# Patient Record
Sex: Female | Born: 1985
Health system: Southern US, Community
[De-identification: ages and names within clinical notes are randomized; demographics above are authoritative.]

## PROBLEM LIST (undated history)

## (undated) DIAGNOSIS — F329 Major depressive disorder, single episode, unspecified: Secondary | ICD-10-CM

## (undated) DIAGNOSIS — F32A Depression, unspecified: Secondary | ICD-10-CM

## (undated) DIAGNOSIS — M1990 Unspecified osteoarthritis, unspecified site: Secondary | ICD-10-CM

## (undated) HISTORY — DX: Unspecified osteoarthritis, unspecified site: M19.90

## (undated) HISTORY — PX: WISDOM TOOTH EXTRACTION: SHX21

---
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
## 2012-03-01 DIAGNOSIS — M1990 Unspecified osteoarthritis, unspecified site: Secondary | ICD-10-CM

## 2012-03-01 HISTORY — DX: Unspecified osteoarthritis, unspecified site: M19.90

## 2012-11-28 ENCOUNTER — Ambulatory Visit (INDEPENDENT_AMBULATORY_CARE_PROVIDER_SITE_OTHER): Payer: 59 | Admitting: Family Medicine

## 2012-11-28 ENCOUNTER — Encounter: Payer: Self-pay | Admitting: Family Medicine

## 2012-11-28 VITALS — BP 103/67 | HR 60 | Temp 98.5°F | Ht 64.0 in | Wt 123.0 lb

## 2012-11-28 DIAGNOSIS — Z0000 Encounter for general adult medical examination without abnormal findings: Secondary | ICD-10-CM | POA: Insufficient documentation

## 2012-11-28 DIAGNOSIS — Z309 Encounter for contraceptive management, unspecified: Secondary | ICD-10-CM

## 2012-11-28 DIAGNOSIS — R1031 Right lower quadrant pain: Secondary | ICD-10-CM

## 2012-11-28 DIAGNOSIS — M1990 Unspecified osteoarthritis, unspecified site: Secondary | ICD-10-CM | POA: Insufficient documentation

## 2012-11-28 DIAGNOSIS — Z23 Encounter for immunization: Secondary | ICD-10-CM

## 2012-11-28 DIAGNOSIS — M1380 Other specified arthritis, unspecified site: Secondary | ICD-10-CM | POA: Insufficient documentation

## 2012-11-28 DIAGNOSIS — IMO0001 Reserved for inherently not codable concepts without codable children: Secondary | ICD-10-CM

## 2012-11-28 DIAGNOSIS — M064 Inflammatory polyarthropathy: Secondary | ICD-10-CM

## 2012-11-28 LAB — LIPID PANEL
LDL Cholesterol: 129 mg/dL — ABNORMAL HIGH (ref 0–99)
Total CHOL/HDL Ratio: 3.3 Ratio
VLDL: 18 mg/dL (ref 0–40)

## 2012-11-28 MED ORDER — NORGESTIMATE-ETH ESTRADIOL 0.25-35 MG-MCG PO TABS: 1.0000 | ORAL_TABLET | Freq: Every day | ORAL | Status: DC

## 2012-11-28 NOTE — Progress Notes (Signed)
  Subjective:    Patient ID: Michelle Lang, female    DOB: [birth year], 27 y.o.   MRN: [id number]  HPI 27 year old female new patient presents to establish care and have a routine physical:  #1 inflammatory arthritis: Patient with his inflammatory arthritis x2 years. She is established with a rheumatologist in the community. She takes Humira. She denies joint pain or swelling. She presented has significant pain and swelling in her bilateral knees and ankles. When she was initially diagnosed she experienced depression and anorexia. She lost 10 pounds. She has gained that weight back and is at her baseline weight.  #2 abdominal pain: Patient with history of intermittent abdominal pain previously attributed to ovarian cysts. She is not had significant pain since starting Sprintec. She takes Sprintec daily. She denies nausea, vomiting, diarrhea, constipation, melena. No personal or family history of irritable bowel syndrome, inflammatory bowel disease or colon cancer.  #3 health maintenance: Patient's last Pap smear was in 2013 and was normal. She has no history of abnormal Paps. She's never been screened for HIV or hyperlipidemia.   Social history reviewed and updated.  Review of Systems As per HPI     Objective:   Physical Exam  Constitutional: She is oriented to person, place, and time. She appears well-developed and well-nourished. No distress.  HENT:  Head: Normocephalic and atraumatic.  Right Ear: External ear normal.  Left Ear: External ear normal.  Mouth/Throat: Oropharynx is clear and moist. No oropharyngeal exudate.  Eyes: Conjunctivae and EOM are normal. Pupils are equal, round, and reactive to light. Right eye exhibits no discharge. Left eye exhibits no discharge.  Neck: Normal range of motion. Neck supple.  Cardiovascular: Normal rate, regular rhythm, normal heart sounds and intact distal pulses.  Exam reveals no gallop and no friction rub.   No murmur heard. Pulmonary/Chest:  Effort normal and breath sounds normal.  Abdominal: Bowel sounds are normal. She exhibits no distension and no mass. There is tenderness. There is no rebound and no guarding.    Musculoskeletal: Normal range of motion. She exhibits no edema and no tenderness.  Lymphadenopathy:    She has no cervical adenopathy.  Neurological: She is alert and oriented to person, place, and time.  Skin: Skin is warm and dry.  Psychiatric: She has a normal mood and affect.      Assessment & Plan:

## 2012-11-28 NOTE — Patient Instructions (Signed)
Wallace,  Very nice to see you again. I will be in touch with blood work results. You can also check your results on your "my chart" patient portal.  I have sent in your sprintec refill.  Please call or e-mail me via "mychart" if you develop changes in bowel habits, persistent or worsening right lower quadrant pain. Also call or e-mail me with the name of the office where your last pap smear was done.   Dr. Armen Pickup

## 2012-11-29 ENCOUNTER — Encounter: Payer: Self-pay | Admitting: Family Medicine

## 2012-11-29 DIAGNOSIS — IMO0001 Reserved for inherently not codable concepts without codable children: Secondary | ICD-10-CM | POA: Insufficient documentation

## 2012-11-29 DIAGNOSIS — R1031 Right lower quadrant pain: Secondary | ICD-10-CM | POA: Insufficient documentation

## 2012-11-29 NOTE — Assessment & Plan Note (Signed)
Sprintec refill x1 year.

## 2012-11-29 NOTE — Assessment & Plan Note (Signed)
Negative HIV and screening lipid panel.

## 2012-11-29 NOTE — Assessment & Plan Note (Signed)
Patient with right lower quadrant tenderness on palpation picked up on routine exam. No associated symptoms. Patient does have inflammatory arthritis and is currently being treated with Humira. Given her history I at increased concern for inflammatory bowel disease, however she does not experience pain at baseline, o changes in stool habits, fevers, chills or weight loss.  Advised patient to followup symptoms and contact me if she does become symptomatic. In the meantime she is advised to continue her regular diet.

## 2013-09-21 ENCOUNTER — Ambulatory Visit (INDEPENDENT_AMBULATORY_CARE_PROVIDER_SITE_OTHER): Payer: 59 | Admitting: *Deleted

## 2013-09-21 DIAGNOSIS — Z23 Encounter for immunization: Secondary | ICD-10-CM

## 2013-10-12 ENCOUNTER — Other Ambulatory Visit: Payer: Self-pay | Admitting: Family Medicine

## 2014-02-15 ENCOUNTER — Encounter: Payer: Self-pay | Admitting: Family Medicine

## 2014-02-15 ENCOUNTER — Ambulatory Visit (INDEPENDENT_AMBULATORY_CARE_PROVIDER_SITE_OTHER): Payer: 59 | Admitting: Family Medicine

## 2014-02-15 VITALS — BP 122/55 | HR 92 | Temp 100.6°F | Wt 121.0 lb

## 2014-02-15 DIAGNOSIS — J029 Acute pharyngitis, unspecified: Secondary | ICD-10-CM | POA: Insufficient documentation

## 2014-02-15 DIAGNOSIS — J020 Streptococcal pharyngitis: Secondary | ICD-10-CM

## 2014-02-15 LAB — POCT RAPID STREP A (OFFICE): RAPID STREP A SCREEN: NEGATIVE

## 2014-02-15 MED ORDER — AMOXICILLIN 500 MG PO CAPS: 500.0000 mg | ORAL_CAPSULE | Freq: Two times a day (BID) | ORAL | Status: DC

## 2014-02-15 NOTE — Progress Notes (Signed)
Patient ID: Michelle Lang, female   DOB: 26-Mar-1985, 28 y.o.   MRN: [id number]   HPI  Patient presents today for SDA for sore throat  She reports 3-4 days of sore throat, chills, malaise. She denies dyspnea, chest pain, palpitations. She has normal oral intake.  She is on Humira for inflammatory arthritis  Smoking status noted ROS: Per HPI  Objective: BP 122/55 mmHg  Pulse 92  Temp(Src) 100.6 F (38.1 C) (Oral)  Wt 121 lb (54.885 kg) Gen: NAD, alert, cooperative with exam HEENT: NCAT, MMM, swollen tonsils with white exudate, nontender shotty lymphadenopathy with several swollen lymph nodes on the left side CV: RRR, good S1/S2, no murmur Resp: CTABL, no wheezes, non-labored   Assessment and plan:  Acute pharyngitis Very likely acute strep pharyngitis given Centor score of 4 out of 4 Rapid strep negative but only has sensitivity of 70%, given Centor score will treat Treat with amoxicillin 5 mg twice daily 10 days Follow up as needed.     Orders Placed This Encounter  Procedures  . POCT rapid strep A    Meds ordered this encounter  Medications  . amoxicillin (AMOXIL) 500 MG capsule    Sig: Take 1 capsule (500 mg total) by mouth 2 (two) times daily.    Dispense:  20 capsule    Refill:  0

## 2014-02-15 NOTE — Patient Instructions (Signed)
Great to see you!  I have sent amoxicillin to the [facility] outpatient pharmacy  Strep Throat Strep throat is an infection of the throat caused by a bacteria named Streptococcus pyogenes. Your health care provider may call the infection streptococcal "tonsillitis" or "pharyngitis" depending on whether there are signs of inflammation in the tonsils or back of the throat. Strep throat is most common in children aged 28-15 years during the cold months of the year, but it can occur in people of any age during any season. This infection is spread from person to person (contagious) through coughing, sneezing, or other close contact. SIGNS AND SYMPTOMS   Fever or chills.  Painful, swollen, red tonsils or throat.  Pain or difficulty when swallowing.  White or yellow spots on the tonsils or throat.  Swollen, tender lymph nodes or "glands" of the neck or under the jaw.  Red rash all over the body (rare). DIAGNOSIS  Many different infections can cause the same symptoms. A test must be done to confirm the diagnosis so the right treatment can be given. A "rapid strep test" can help your health care provider make the diagnosis in a few minutes. If this test is not available, a light swab of the infected area can be used for a throat culture test. If a throat culture test is done, results are usually available in a day or two. TREATMENT  Strep throat is treated with antibiotic medicine. HOME CARE INSTRUCTIONS   Gargle with 1 tsp of salt in 1 cup of warm water, 3-4 times per day or as needed for comfort.  Family members who also have a sore throat or fever should be tested for strep throat and treated with antibiotics if they have the strep infection.  Make sure everyone in your household washes their hands well.  Do not share food, drinking cups, or personal items that could cause the infection to spread to others.  You may need to eat a soft food diet until your sore throat gets better.  Drink enough  water and fluids to keep your urine clear or pale yellow. This will help prevent dehydration.  Get plenty of rest.  Stay home from school, day care, or work until you have been on antibiotics for 24 hours.  Take medicines only as directed by your health care provider.  Take your antibiotic medicine as directed by your health care provider. Finish it even if you start to feel better. SEEK MEDICAL CARE IF:   The glands in your neck continue to enlarge.  You develop a rash, cough, or earache.  You cough up green, yellow-brown, or bloody sputum.  You have pain or discomfort not controlled by medicines.  Your problems seem to be getting worse rather than better.  You have a fever. SEEK IMMEDIATE MEDICAL CARE IF:   You develop any new symptoms such as vomiting, severe headache, stiff or painful neck, chest pain, shortness of breath, or trouble swallowing.  You develop severe throat pain, drooling, or changes in your voice.  You develop swelling of the neck, or the skin on the neck becomes red and tender.  You develop signs of dehydration, such as fatigue, dry mouth, and decreased urination.  You become increasingly sleepy, or you cannot wake up completely. MAKE SURE YOU:  Understand these instructions.  Will watch your condition.  Will get help right away if you are not doing well or get worse. Document Released: 02/13/2000 Document Revised: 07/02/2013 Document Reviewed: 04/16/2010 ExitCare Patient Information  2015 [employer]. This information is not intended to replace advice given to you by your health care provider. Make sure you discuss any questions you have with your health care provider.  

## 2014-02-15 NOTE — Assessment & Plan Note (Addendum)
Very likely acute strep pharyngitis given Centor score of 4 out of 4 Rapid strep negative but only has sensitivity of 70%, given Centor score will treat Treat with amoxicillin 5 mg twice daily 10 days Follow up as needed.

## 2014-09-25 ENCOUNTER — Other Ambulatory Visit: Payer: Self-pay | Admitting: Family Medicine

## 2014-09-25 ENCOUNTER — Telehealth: Payer: Self-pay | Admitting: Family Medicine

## 2014-09-25 DIAGNOSIS — Z3041 Encounter for surveillance of contraceptive pills: Secondary | ICD-10-CM

## 2014-09-25 MED ORDER — NORGESTIMATE-ETH ESTRADIOL 0.25-35 MG-MCG PO TABS: 1.0000 | ORAL_TABLET | Freq: Every day | ORAL | Status: DC

## 2014-09-26 NOTE — Telephone Encounter (Signed)
Opened in error

## 2014-10-08 DIAGNOSIS — M21619 Bunion of unspecified foot: Secondary | ICD-10-CM | POA: Insufficient documentation

## 2014-11-12 ENCOUNTER — Ambulatory Visit: Payer: 59 | Admitting: Sports Medicine

## 2014-12-25 ENCOUNTER — Ambulatory Visit: Payer: 59 | Admitting: Sports Medicine

## 2015-03-25 MED FILL — HUMIRA PEN 40 MG/0.8ML PNKT: 40 | 28 days supply | Qty: 2 | Fill #1

## 2015-05-02 MED FILL — HUMIRA PEN 40 MG/0.8ML PNKT: 40 | 28 days supply | Qty: 2 | Fill #2

## 2015-05-14 DIAGNOSIS — H5213 Myopia, bilateral: Secondary | ICD-10-CM | POA: Diagnosis not present

## 2015-06-09 MED FILL — HUMIRA PEN 40 MG/0.8ML PNKT: 40 | 28 days supply | Qty: 2 | Fill #3

## 2015-06-19 DIAGNOSIS — M0609 Rheumatoid arthritis without rheumatoid factor, multiple sites: Secondary | ICD-10-CM | POA: Diagnosis not present

## 2015-07-17 MED FILL — HUMIRA PEN 40 MG/0.8ML PNKT: 40 | 28 days supply | Qty: 2 | Fill #4

## 2015-09-29 DIAGNOSIS — Z01419 Encounter for gynecological examination (general) (routine) without abnormal findings: Secondary | ICD-10-CM | POA: Diagnosis not present

## 2015-09-29 DIAGNOSIS — Z6821 Body mass index (BMI) 21.0-21.9, adult: Secondary | ICD-10-CM | POA: Diagnosis not present

## 2015-09-29 DIAGNOSIS — Z3202 Encounter for pregnancy test, result negative: Secondary | ICD-10-CM | POA: Diagnosis not present

## 2015-12-18 DIAGNOSIS — M0609 Rheumatoid arthritis without rheumatoid factor, multiple sites: Secondary | ICD-10-CM | POA: Diagnosis not present

## 2015-12-18 MED FILL — HUMIRA PEN 40 MG/0.8ML PNKT: 40 | 28 days supply | Qty: 2 | Fill #0

## 2015-12-23 ENCOUNTER — Ambulatory Visit (HOSPITAL_BASED_OUTPATIENT_CLINIC_OR_DEPARTMENT_OTHER): Payer: 59 | Admitting: Pharmacist

## 2015-12-23 DIAGNOSIS — M069 Rheumatoid arthritis, unspecified: Secondary | ICD-10-CM

## 2015-12-23 MED ORDER — ADALIMUMAB 40 MG/0.8ML ~~LOC~~ AJKT: 0.8000 mL | AUTO-INJECTOR | SUBCUTANEOUS | 4 refills | Status: DC

## 2015-12-23 NOTE — Progress Notes (Signed)
   S: Patient presents today to the [facility].  Patient is currently taking Humira for rheumatoid arthritis. Patient is managed by Leigh Aurora for this.   Adherence: came off of Humira a few months ago as she was trying to get pregnant but it is taking longer than expected to become pregnant so she is back on it until she is pregnant.  Efficacy: has led to control of RA  Dosing:  Rheumatoid arthritis: SubQ: 40 mg every other week (may continue methotrexate, other nonbiologic DMARDS, corticosteroids, NSAIDs, and/or analgesics); patients not taking concomitant methotrexate may increase dose to 40 mg every week  Drug-drug interactions: none  Screening: TB test: completed per patient Hepatitis: completed per patient  Monitoring: S/sx of infection: denies CBC: regularly monitored by Dr. Melissa Noon office S/sx of hypersensitivity: denies S/sx of malignancy: denies S/sx of heart failure: denies  O:     No results found for: WBC, HGB, HCT, MCV, PLT    Chemistry   No results found for: NA, K, CL, CO2, BUN, CREATININE, GLU No results found for: CALCIUM, ALKPHOS, AST, ALT, BILITOT   Last labs from 2016 were WNL (reviewed via [facility])  A/P: 1. Medication review: Patient currently on Humira for rheumatoid arthritis. She is tolerating the medication well with no adverse effects and improved control of her RA. Reviewed the medication with the patient, including the following: Humira is a TNF blocking agent indicated for ankylosing spondylitis, Crohn's disease, Hidradenitis suppurativa, psoriatic arthritis, plaque psoriasis, ulcerative colitis, and uveitis. The most common adverse effects are infections, headache, and injection site reactions. There is the possibility of an increased risk of malignancy but it is not well understood if this increased risk is due to there medication or the disease state. There are rare cases of pancytopenia and  aplastic anemia. No recommendations for any changes and agree with plan to stop Humira once she become pregnant. Will request most recent lab work from Dr. Melissa Noon office.   Nicoletta Ba, PharmD, BCPS, BCACP, [facility] [phone]

## 2016-01-28 MED FILL — HUMIRA PEN 40 MG/0.8ML PNKT: 40 | 28 days supply | Qty: 2 | Fill #0

## 2016-02-24 MED FILL — HUMIRA PEN 40 MG/0.8ML PNKT: 40 | 28 days supply | Qty: 2 | Fill #1

## 2016-03-19 DIAGNOSIS — F4323 Adjustment disorder with mixed anxiety and depressed mood: Secondary | ICD-10-CM | POA: Diagnosis not present

## 2016-03-24 MED FILL — HUMIRA PEN 40 MG/0.8ML PNKT: 40 | 28 days supply | Qty: 2 | Fill #2

## 2016-03-25 DIAGNOSIS — F4323 Adjustment disorder with mixed anxiety and depressed mood: Secondary | ICD-10-CM | POA: Diagnosis not present

## 2016-04-14 DIAGNOSIS — N979 Female infertility, unspecified: Secondary | ICD-10-CM | POA: Diagnosis not present

## 2016-04-14 DIAGNOSIS — Z3149 Encounter for other procreative investigation and testing: Secondary | ICD-10-CM | POA: Diagnosis not present

## 2016-04-23 MED FILL — HUMIRA PEN 40 MG/0.8ML PNKT: 40 | 28 days supply | Qty: 2 | Fill #3

## 2016-04-30 DIAGNOSIS — N979 Female infertility, unspecified: Secondary | ICD-10-CM | POA: Diagnosis not present

## 2016-05-12 ENCOUNTER — Other Ambulatory Visit (HOSPITAL_COMMUNITY): Payer: Self-pay | Admitting: Obstetrics and Gynecology

## 2016-05-12 DIAGNOSIS — Z3141 Encounter for fertility testing: Secondary | ICD-10-CM

## 2016-05-17 ENCOUNTER — Ambulatory Visit (HOSPITAL_COMMUNITY)
Admission: RE | Admit: 2016-05-17 | Discharge: 2016-05-17 | Disposition: A | Discharge status: Home / Self Care | Payer: 59 | Source: Ambulatory Visit | Attending: Obstetrics and Gynecology | Admitting: Obstetrics and Gynecology

## 2016-05-17 DIAGNOSIS — Z3141 Encounter for fertility testing: Secondary | ICD-10-CM

## 2016-05-17 DIAGNOSIS — N979 Female infertility, unspecified: Secondary | ICD-10-CM | POA: Insufficient documentation

## 2016-05-17 MED ORDER — IOPAMIDOL (ISOVUE-300) INJECTION 61%
30.0000 mL | Freq: Once | INTRAVENOUS | Status: AC | PRN
  Administered 2016-05-17: 5 mL via ORAL

## 2016-05-20 MED FILL — HUMIRA PEN 40 MG/0.8ML PNKT: 40 | 28 days supply | Qty: 2 | Fill #4

## 2016-06-15 ENCOUNTER — Other Ambulatory Visit: Payer: Self-pay | Admitting: Pharmacist

## 2016-06-15 MED ORDER — ADALIMUMAB 40 MG/0.8ML ~~LOC~~ AJKT: 0.8000 mL | AUTO-INJECTOR | SUBCUTANEOUS | 0 refills | Status: DC

## 2016-06-15 MED FILL — HUMIRA PEN 40 MG/0.8ML PNKT: 40 | 28 days supply | Qty: 2 | Fill #0

## 2016-06-15 MED FILL — CLOMIPHENE CITRATE 50 MG TA: 50 | 30 days supply | Qty: 5 | Fill #0

## 2016-06-17 DIAGNOSIS — Z6822 Body mass index (BMI) 22.0-22.9, adult: Secondary | ICD-10-CM | POA: Diagnosis not present

## 2016-06-17 DIAGNOSIS — M0609 Rheumatoid arthritis without rheumatoid factor, multiple sites: Secondary | ICD-10-CM | POA: Diagnosis not present

## 2016-07-30 DIAGNOSIS — N979 Female infertility, unspecified: Secondary | ICD-10-CM | POA: Diagnosis not present

## 2016-08-03 MED FILL — CLOMIPHENE CITRATE 50 MG TA: 50 | 5 days supply | Qty: 5 | Fill #0

## 2016-08-09 ENCOUNTER — Other Ambulatory Visit: Payer: Self-pay | Admitting: Pharmacist

## 2016-08-09 MED ORDER — ADALIMUMAB 40 MG/0.8ML ~~LOC~~ AJKT: 0.8000 mL | AUTO-INJECTOR | SUBCUTANEOUS | 3 refills | Status: DC

## 2016-08-09 MED FILL — HUMIRA PEN 40 MG/0.8ML PNKT: 40 | 28 days supply | Qty: 2 | Fill #0

## 2016-08-24 DIAGNOSIS — N979 Female infertility, unspecified: Secondary | ICD-10-CM | POA: Diagnosis not present

## 2016-09-21 DIAGNOSIS — N911 Secondary amenorrhea: Secondary | ICD-10-CM | POA: Diagnosis not present

## 2016-09-30 DIAGNOSIS — Z3401 Encounter for supervision of normal first pregnancy, first trimester: Secondary | ICD-10-CM | POA: Diagnosis not present

## 2016-09-30 DIAGNOSIS — Z13228 Encounter for screening for other metabolic disorders: Secondary | ICD-10-CM | POA: Diagnosis not present

## 2016-09-30 DIAGNOSIS — Z3685 Encounter for antenatal screening for Streptococcus B: Secondary | ICD-10-CM | POA: Diagnosis not present

## 2016-10-14 DIAGNOSIS — O26891 Other specified pregnancy related conditions, first trimester: Secondary | ICD-10-CM | POA: Diagnosis not present

## 2016-10-14 DIAGNOSIS — Z3401 Encounter for supervision of normal first pregnancy, first trimester: Secondary | ICD-10-CM | POA: Diagnosis not present

## 2016-10-14 DIAGNOSIS — Z113 Encounter for screening for infections with a predominantly sexual mode of transmission: Secondary | ICD-10-CM | POA: Diagnosis not present

## 2016-10-14 DIAGNOSIS — Z3A10 10 weeks gestation of pregnancy: Secondary | ICD-10-CM | POA: Diagnosis not present

## 2016-10-14 DIAGNOSIS — Z3480 Encounter for supervision of other normal pregnancy, unspecified trimester: Secondary | ICD-10-CM | POA: Diagnosis not present

## 2016-10-27 DIAGNOSIS — Z36 Encounter for antenatal screening for chromosomal anomalies: Secondary | ICD-10-CM | POA: Diagnosis not present

## 2016-10-27 DIAGNOSIS — Z3682 Encounter for antenatal screening for nuchal translucency: Secondary | ICD-10-CM | POA: Diagnosis not present

## 2016-10-27 DIAGNOSIS — Z3491 Encounter for supervision of normal pregnancy, unspecified, first trimester: Secondary | ICD-10-CM | POA: Diagnosis not present

## 2016-12-10 DIAGNOSIS — Z363 Encounter for antenatal screening for malformations: Secondary | ICD-10-CM | POA: Diagnosis not present

## 2016-12-10 DIAGNOSIS — Z23 Encounter for immunization: Secondary | ICD-10-CM | POA: Diagnosis not present

## 2016-12-10 DIAGNOSIS — Z3400 Encounter for supervision of normal first pregnancy, unspecified trimester: Secondary | ICD-10-CM | POA: Diagnosis not present

## 2016-12-10 DIAGNOSIS — Z3A18 18 weeks gestation of pregnancy: Secondary | ICD-10-CM | POA: Diagnosis not present

## 2016-12-17 DIAGNOSIS — M0609 Rheumatoid arthritis without rheumatoid factor, multiple sites: Secondary | ICD-10-CM | POA: Diagnosis not present

## 2016-12-17 DIAGNOSIS — E663 Overweight: Secondary | ICD-10-CM | POA: Diagnosis not present

## 2016-12-17 DIAGNOSIS — Z6825 Body mass index (BMI) 25.0-25.9, adult: Secondary | ICD-10-CM | POA: Diagnosis not present

## 2017-01-11 DIAGNOSIS — Z3400 Encounter for supervision of normal first pregnancy, unspecified trimester: Secondary | ICD-10-CM | POA: Diagnosis not present

## 2017-01-11 DIAGNOSIS — Z3A23 23 weeks gestation of pregnancy: Secondary | ICD-10-CM | POA: Diagnosis not present

## 2017-01-11 DIAGNOSIS — Z362 Encounter for other antenatal screening follow-up: Secondary | ICD-10-CM | POA: Diagnosis not present

## 2017-02-04 DIAGNOSIS — L819 Disorder of pigmentation, unspecified: Secondary | ICD-10-CM | POA: Diagnosis not present

## 2017-02-04 DIAGNOSIS — D1801 Hemangioma of skin and subcutaneous tissue: Secondary | ICD-10-CM | POA: Diagnosis not present

## 2017-02-04 DIAGNOSIS — I788 Other diseases of capillaries: Secondary | ICD-10-CM | POA: Diagnosis not present

## 2017-02-10 DIAGNOSIS — O4402 Placenta previa specified as without hemorrhage, second trimester: Secondary | ICD-10-CM | POA: Diagnosis not present

## 2017-02-10 DIAGNOSIS — Z23 Encounter for immunization: Secondary | ICD-10-CM | POA: Diagnosis not present

## 2017-02-10 DIAGNOSIS — Z3A27 27 weeks gestation of pregnancy: Secondary | ICD-10-CM | POA: Diagnosis not present

## 2017-02-10 DIAGNOSIS — Z3400 Encounter for supervision of normal first pregnancy, unspecified trimester: Secondary | ICD-10-CM | POA: Diagnosis not present

## 2017-03-16 DIAGNOSIS — Z3A32 32 weeks gestation of pregnancy: Secondary | ICD-10-CM | POA: Diagnosis not present

## 2017-03-16 DIAGNOSIS — Z3400 Encounter for supervision of normal first pregnancy, unspecified trimester: Secondary | ICD-10-CM | POA: Diagnosis not present

## 2017-03-16 DIAGNOSIS — O3663X0 Maternal care for excessive fetal growth, third trimester, not applicable or unspecified: Secondary | ICD-10-CM | POA: Diagnosis not present

## 2017-03-16 DIAGNOSIS — O4403 Placenta previa specified as without hemorrhage, third trimester: Secondary | ICD-10-CM | POA: Diagnosis not present

## 2017-04-05 ENCOUNTER — Encounter (HOSPITAL_COMMUNITY): Payer: Self-pay | Admitting: *Deleted

## 2017-04-05 NOTE — H&P (Addendum)
Eola Waldrep is a 32 y.o. female presenting for primary c-section.  IVF pregnancy complicated by posterior placenta previa.  No VB.  Good FM. [place] confirms previa, EFW 92% OB History    Gravida Para Term Preterm AB Living   0 0 0 0 0 0   SAB TAB Ectopic Multiple Live Births   0 0 0 0       Past Medical History:  Diagnosis Date  . Inflammatory arthritis 2014   Past Surgical History:  Procedure Laterality Date  . APPENDECTOMY  2003   Family History: family history is not on file. Social History:  reports that  has never smoked. she has never used smokeless tobacco. She reports that she drinks about 7.7 oz of alcohol per week. She reports that she does not use drugs.     Maternal Diabetes: No Genetic Screening: Normal Maternal Ultrasounds/Referrals: Normal Fetal Ultrasounds or other Referrals:  None Maternal Substance Abuse:  No Significant Maternal Medications:  None Significant Maternal Lab Results:  Mild anemia Other Comments:  None  ROS History   AF, VSS Exam Physical Exam  Gen - NAD Abd - gravid, NT Ext - NT Prenatal labs: ABO, Rh:   Antibody:   Rubella:   RPR:    HBsAg:    HIV:    GBS:   neg  Assessment/Plan: Placenta previa Primary c-section  R/b/a discussed including risk of bleeding, transfusion, infection, damage to surrounding organs that may require further surgery to repair.  Also d/w pt and FOB risk of hysterectomy.  All questions answered, informed consent Marylynn Pearson 04/05/2017, 8:31 AM

## 2017-04-07 ENCOUNTER — Ambulatory Visit (HOSPITAL_BASED_OUTPATIENT_CLINIC_OR_DEPARTMENT_OTHER): Payer: 59 | Admitting: Pharmacist

## 2017-04-07 DIAGNOSIS — Z6829 Body mass index (BMI) 29.0-29.9, adult: Secondary | ICD-10-CM | POA: Diagnosis not present

## 2017-04-07 DIAGNOSIS — E663 Overweight: Secondary | ICD-10-CM | POA: Diagnosis not present

## 2017-04-07 DIAGNOSIS — M0609 Rheumatoid arthritis without rheumatoid factor, multiple sites: Secondary | ICD-10-CM | POA: Diagnosis not present

## 2017-04-07 DIAGNOSIS — Z79899 Other long term (current) drug therapy: Secondary | ICD-10-CM

## 2017-04-07 MED ORDER — ADALIMUMAB 40 MG/0.8ML ~~LOC~~ AJKT: 0.8000 mL | AUTO-INJECTOR | SUBCUTANEOUS | 5 refills | Status: DC

## 2017-04-07 MED FILL — HUMIRA PEN 40 MG/0.8ML PNKT: 40 | 28 days supply | Qty: 2 | Fill #0

## 2017-04-07 NOTE — Progress Notes (Signed)
   S: Patient presents today to the [facility].  Patient is NOT currently taking Humira for rheumatoid arthritis. However, she is getting ready to restart after her planned C section this month. Patient is managed by Leigh Aurora for this.   Adherence: has been off while she has been pregnant.  Efficacy: has led to control of RA when on it, currently doing ok.  Dosing:  Rheumatoid arthritis: SubQ: 40 mg every other week (may continue methotrexate, other nonbiologic DMARDS, corticosteroids, NSAIDs, and/or analgesics); patients not taking concomitant methotrexate may increase dose to 40 mg every week  Drug-drug interactions: none  Screening: TB test: completed per patient Hepatitis: completed per patient  Monitoring: S/sx of infection: denies CBC: regularly monitored by Dr. Melissa Noon office   O:     No results found for: WBC, HGB, HCT, MCV, PLT    Chemistry   No results found for: NA, K, CL, CO2, BUN, CREATININE, GLU No results found for: CALCIUM, ALKPHOS, AST, ALT, BILITOT   Last labs from 2016 were WNL (reviewed via [facility])  A/P: 1. Medication review: Patient currently holding Humira for rheumatoid arthritis until after baby is delivered this month. Patient has been on Humira previously and has no questions, aware of increased risk of infection and possible increased risk of malignancy and is closely follow by Dr. Amil Amen. No recommendations for any changes.    Christella Hartigan, PharmD, BCPS, BCACP, [facility] [phone]

## 2017-04-14 DIAGNOSIS — O4403 Placenta previa specified as without hemorrhage, third trimester: Secondary | ICD-10-CM | POA: Diagnosis not present

## 2017-04-14 DIAGNOSIS — Z3400 Encounter for supervision of normal first pregnancy, unspecified trimester: Secondary | ICD-10-CM | POA: Diagnosis not present

## 2017-04-14 DIAGNOSIS — Z3685 Encounter for antenatal screening for Streptococcus B: Secondary | ICD-10-CM | POA: Diagnosis not present

## 2017-04-14 DIAGNOSIS — Z3A36 36 weeks gestation of pregnancy: Secondary | ICD-10-CM | POA: Diagnosis not present

## 2017-04-19 ENCOUNTER — Encounter (HOSPITAL_COMMUNITY)
Admission: RE | Admit: 2017-04-19 | Discharge: 2017-04-19 | Disposition: A | Discharge status: Home / Self Care | Payer: 59 | Source: Ambulatory Visit | Attending: Obstetrics and Gynecology | Admitting: Obstetrics and Gynecology

## 2017-04-19 DIAGNOSIS — O9902 Anemia complicating childbirth: Secondary | ICD-10-CM | POA: Diagnosis not present

## 2017-04-19 DIAGNOSIS — D649 Anemia, unspecified: Secondary | ICD-10-CM | POA: Diagnosis not present

## 2017-04-19 DIAGNOSIS — Z3A37 37 weeks gestation of pregnancy: Secondary | ICD-10-CM | POA: Diagnosis not present

## 2017-04-19 DIAGNOSIS — O4403 Placenta previa specified as without hemorrhage, third trimester: Secondary | ICD-10-CM | POA: Diagnosis not present

## 2017-04-19 HISTORY — DX: Depression, unspecified: F32.A

## 2017-04-19 HISTORY — DX: Major depressive disorder, single episode, unspecified: F32.9

## 2017-04-19 LAB — ABO/RH: ABO/RH(D): A POS

## 2017-04-19 LAB — CBC
HCT: 33.9 % — ABNORMAL LOW (ref 36.0–46.0)
HEMOGLOBIN: 11.6 g/dL — AB (ref 12.0–15.0)
MCH: 31 pg (ref 26.0–34.0)
MCHC: 34.2 g/dL (ref 30.0–36.0)
MCV: 90.6 fL (ref 78.0–100.0)
PLATELETS: 191 10*3/uL (ref 150–400)
RBC: 3.74 MIL/uL — AB (ref 3.87–5.11)
RDW: 16.5 % — ABNORMAL HIGH (ref 11.5–15.5)
WBC: 10.1 10*3/uL (ref 4.0–10.5)

## 2017-04-19 NOTE — Patient Instructions (Signed)
Hila Bolding  04/19/2017   Your procedure is scheduled on:  04/20/2017  Enter through the Main Entrance of [facility] at [place] up the phone at the desk and dial [phone]  Call this number if you have problems the morning of surgery:[phone]  Remember:   Do not eat food:(After Midnight) Desps de medianoche.  Do not drink clear liquids: (After Midnight) Desps de medianoche.  Take these medicines the morning of surgery with A SIP OF WATER: none   Do not wear jewelry, make-up or nail polish.  Do not wear lotions, powders, or perfumes. Do not wear deodorant.  Do not shave 48 hours prior to surgery.  Do not bring valuables to the hospital.  [facility] is not   responsible for any belongings or valuables brought to the hospital.  Contacts, dentures or bridgework may not be worn into surgery.  Leave suitcase in the car. After surgery it may be brought to your room.  For patients admitted to the hospital, checkout time is 11:00 AM the day of              discharge.    N/A   Please read over the following fact sheets that you were given:   Surgical Site Infection Prevention

## 2017-04-20 ENCOUNTER — Inpatient Hospital Stay (HOSPITAL_COMMUNITY): Payer: 59 | Admitting: Anesthesiology

## 2017-04-20 ENCOUNTER — Inpatient Hospital Stay (HOSPITAL_COMMUNITY)
Admission: AD | Admit: 2017-04-20 | Discharge: 2017-04-22 | DRG: 788 | Disposition: A | Discharge status: Home / Self Care | Payer: 59 | Source: Ambulatory Visit | Attending: Obstetrics and Gynecology | Admitting: Obstetrics and Gynecology

## 2017-04-20 ENCOUNTER — Encounter (HOSPITAL_COMMUNITY): Payer: Self-pay | Admitting: *Deleted

## 2017-04-20 ENCOUNTER — Other Ambulatory Visit: Payer: Self-pay

## 2017-04-20 ENCOUNTER — Encounter (HOSPITAL_COMMUNITY)
Admission: AD | Disposition: A | Discharge status: Home / Self Care | Payer: Self-pay | Source: Ambulatory Visit | Attending: Obstetrics and Gynecology

## 2017-04-20 DIAGNOSIS — O4400 Placenta previa specified as without hemorrhage, unspecified trimester: Secondary | ICD-10-CM | POA: Diagnosis not present

## 2017-04-20 DIAGNOSIS — O9902 Anemia complicating childbirth: Secondary | ICD-10-CM | POA: Diagnosis present

## 2017-04-20 DIAGNOSIS — D649 Anemia, unspecified: Secondary | ICD-10-CM | POA: Diagnosis present

## 2017-04-20 DIAGNOSIS — O4423 Partial placenta previa NOS or without hemorrhage, third trimester: Secondary | ICD-10-CM | POA: Diagnosis not present

## 2017-04-20 DIAGNOSIS — Z8759 Personal history of other complications of pregnancy, childbirth and the puerperium: Secondary | ICD-10-CM

## 2017-04-20 DIAGNOSIS — Z3A00 Weeks of gestation of pregnancy not specified: Secondary | ICD-10-CM | POA: Diagnosis not present

## 2017-04-20 DIAGNOSIS — O4403 Placenta previa specified as without hemorrhage, third trimester: Secondary | ICD-10-CM | POA: Diagnosis present

## 2017-04-20 DIAGNOSIS — Z3A37 37 weeks gestation of pregnancy: Secondary | ICD-10-CM

## 2017-04-20 LAB — RPR: RPR: NONREACTIVE

## 2017-04-20 LAB — PREPARE RBC (CROSSMATCH)

## 2017-04-20 SURGERY — Surgical Case
Anesthesia: Spinal

## 2017-04-20 MED ORDER — NALOXONE HCL 0.4 MG/ML IJ SOLN: 0.4000 mg | INTRAMUSCULAR | Status: DC | PRN

## 2017-04-20 MED ORDER — ONDANSETRON HCL 4 MG/2ML IJ SOLN
INTRAMUSCULAR | Status: DC | PRN
  Administered 2017-04-20: 4 mg via INTRAVENOUS

## 2017-04-20 MED ORDER — PHENYLEPHRINE 40 MCG/ML (10ML) SYRINGE FOR IV PUSH (FOR BLOOD PRESSURE SUPPORT)
PREFILLED_SYRINGE | INTRAVENOUS | Status: AC
  Filled 2017-04-20: qty 10

## 2017-04-20 MED ORDER — FENTANYL CITRATE (PF) 100 MCG/2ML IJ SOLN
INTRAMUSCULAR | Status: AC
  Filled 2017-04-20: qty 2

## 2017-04-20 MED ORDER — SODIUM CHLORIDE 0.9% FLUSH: 3.0000 mL | INTRAVENOUS | Status: DC | PRN

## 2017-04-20 MED ORDER — NALBUPHINE HCL 10 MG/ML IJ SOLN: 5.0000 mg | Freq: Once | INTRAMUSCULAR | Status: DC | PRN

## 2017-04-20 MED ORDER — PHENYLEPHRINE 8 MG IN D5W 100 ML (0.08MG/ML) PREMIX OPTIME
INJECTION | INTRAVENOUS | Status: DC | PRN
  Administered 2017-04-20: 60 ug/min via INTRAVENOUS

## 2017-04-20 MED ORDER — SIMETHICONE 80 MG PO CHEW: 80.0000 mg | CHEWABLE_TABLET | ORAL | Status: DC | PRN

## 2017-04-20 MED ORDER — PHENYLEPHRINE HCL 10 MG/ML IJ SOLN
INTRAMUSCULAR | Status: DC | PRN
  Administered 2017-04-20: 40 ug via INTRAVENOUS

## 2017-04-20 MED ORDER — PROMETHAZINE HCL 25 MG/ML IJ SOLN: 6.2500 mg | INTRAMUSCULAR | Status: DC | PRN

## 2017-04-20 MED ORDER — ACETAMINOPHEN 325 MG PO TABS
650.0000 mg | ORAL_TABLET | ORAL | Status: DC | PRN
  Administered 2017-04-20 – 2017-04-21 (×2): 650 mg via ORAL
  Filled 2017-04-20 (×2): qty 2

## 2017-04-20 MED ORDER — MENTHOL 3 MG MT LOZG: 1.0000 | LOZENGE | OROMUCOSAL | Status: DC | PRN

## 2017-04-20 MED ORDER — KETOROLAC TROMETHAMINE 30 MG/ML IJ SOLN: 30.0000 mg | Freq: Once | INTRAMUSCULAR | Status: DC | PRN

## 2017-04-20 MED ORDER — DIPHENHYDRAMINE HCL 25 MG PO CAPS: 25.0000 mg | ORAL_CAPSULE | Freq: Four times a day (QID) | ORAL | Status: DC | PRN

## 2017-04-20 MED ORDER — OXYTOCIN 10 UNIT/ML IJ SOLN
INTRAVENOUS | Status: DC | PRN
  Administered 2017-04-20: 40 [IU] via INTRAVENOUS

## 2017-04-20 MED ORDER — MEASLES, MUMPS & RUBELLA VAC ~~LOC~~ INJ
0.5000 mL | INJECTION | Freq: Once | SUBCUTANEOUS | Status: DC
  Filled 2017-04-20: qty 0.5

## 2017-04-20 MED ORDER — HYDROCODONE-ACETAMINOPHEN 5-325 MG PO TABS
1.0000 | ORAL_TABLET | ORAL | Status: DC | PRN
  Administered 2017-04-21 – 2017-04-22 (×3): 1 via ORAL
  Filled 2017-04-20 (×3): qty 1

## 2017-04-20 MED ORDER — OXYCODONE HCL 5 MG PO TABS: 5.0000 mg | ORAL_TABLET | Freq: Once | ORAL | Status: DC | PRN

## 2017-04-20 MED ORDER — MORPHINE SULFATE (PF) 0.5 MG/ML IJ SOLN
INTRAMUSCULAR | Status: AC
  Filled 2017-04-20: qty 10

## 2017-04-20 MED ORDER — SCOPOLAMINE 1 MG/3DAYS TD PT72
1.0000 | MEDICATED_PATCH | Freq: Once | TRANSDERMAL | Status: DC
  Administered 2017-04-20: 1.5 mg via TRANSDERMAL
  Filled 2017-04-20: qty 1

## 2017-04-20 MED ORDER — NALBUPHINE HCL 10 MG/ML IJ SOLN: 5.0000 mg | INTRAMUSCULAR | Status: DC | PRN

## 2017-04-20 MED ORDER — ONDANSETRON HCL 4 MG/2ML IJ SOLN
INTRAMUSCULAR | Status: AC
  Filled 2017-04-20: qty 2

## 2017-04-20 MED ORDER — PRENATAL MULTIVITAMIN CH
1.0000 | ORAL_TABLET | Freq: Every day | ORAL | Status: DC
  Administered 2017-04-21 – 2017-04-22 (×2): 1 via ORAL
  Filled 2017-04-20 (×2): qty 1

## 2017-04-20 MED ORDER — LIDOCAINE HCL (PF) 1 % IJ SOLN
INTRAMUSCULAR | Status: AC
  Filled 2017-04-20: qty 5

## 2017-04-20 MED ORDER — MEPERIDINE HCL 25 MG/ML IJ SOLN: 6.2500 mg | INTRAMUSCULAR | Status: DC | PRN

## 2017-04-20 MED ORDER — TETANUS-DIPHTH-ACELL PERTUSSIS 5-2.5-18.5 LF-MCG/0.5 IM SUSP: 0.5000 mL | Freq: Once | INTRAMUSCULAR | Status: DC

## 2017-04-20 MED ORDER — HYDROMORPHONE HCL 1 MG/ML IJ SOLN: 0.2500 mg | INTRAMUSCULAR | Status: DC | PRN

## 2017-04-20 MED ORDER — NALOXONE HCL 4 MG/10ML IJ SOLN
1.0000 ug/kg/h | INTRAVENOUS | Status: DC | PRN
  Filled 2017-04-20: qty 5

## 2017-04-20 MED ORDER — MEDROXYPROGESTERONE ACETATE 150 MG/ML IM SUSP: 150.0000 mg | INTRAMUSCULAR | Status: DC | PRN

## 2017-04-20 MED ORDER — OXYTOCIN 40 UNITS IN LACTATED RINGERS INFUSION - SIMPLE MED: 2.5000 [IU]/h | INTRAVENOUS | Status: AC

## 2017-04-20 MED ORDER — LACTATED RINGERS IV SOLN
INTRAVENOUS | Status: DC | PRN
  Administered 2017-04-20: 13:00:00 via INTRAVENOUS

## 2017-04-20 MED ORDER — PHENYLEPHRINE 8 MG IN D5W 100 ML (0.08MG/ML) PREMIX OPTIME
INJECTION | INTRAVENOUS | Status: AC
  Filled 2017-04-20: qty 100

## 2017-04-20 MED ORDER — DIPHENHYDRAMINE HCL 25 MG PO CAPS: 25.0000 mg | ORAL_CAPSULE | ORAL | Status: DC | PRN

## 2017-04-20 MED ORDER — DEXTROSE IN LACTATED RINGERS 5 % IV SOLN
INTRAVENOUS | Status: DC
  Administered 2017-04-20: 18:00:00 via INTRAVENOUS

## 2017-04-20 MED ORDER — WITCH HAZEL-GLYCERIN EX PADS: 1.0000 "application " | MEDICATED_PAD | CUTANEOUS | Status: DC | PRN

## 2017-04-20 MED ORDER — ONDANSETRON HCL 4 MG/2ML IJ SOLN: 4.0000 mg | Freq: Three times a day (TID) | INTRAMUSCULAR | Status: DC | PRN

## 2017-04-20 MED ORDER — BUPIVACAINE IN DEXTROSE 0.75-8.25 % IT SOLN
INTRATHECAL | Status: DC | PRN
  Administered 2017-04-20: 1.5 mL via INTRATHECAL

## 2017-04-20 MED ORDER — OXYTOCIN 10 UNIT/ML IJ SOLN
INTRAMUSCULAR | Status: AC
  Filled 2017-04-20: qty 4

## 2017-04-20 MED ORDER — SIMETHICONE 80 MG PO CHEW
80.0000 mg | CHEWABLE_TABLET | Freq: Three times a day (TID) | ORAL | Status: DC
  Administered 2017-04-21 – 2017-04-22 (×4): 80 mg via ORAL
  Filled 2017-04-20 (×4): qty 1

## 2017-04-20 MED ORDER — FENTANYL CITRATE (PF) 100 MCG/2ML IJ SOLN
INTRAMUSCULAR | Status: DC | PRN
  Administered 2017-04-20: 10 ug via INTRATHECAL

## 2017-04-20 MED ORDER — MORPHINE SULFATE (PF) 0.5 MG/ML IJ SOLN
INTRAMUSCULAR | Status: DC | PRN
  Administered 2017-04-20: .2 mg via INTRATHECAL

## 2017-04-20 MED ORDER — SENNOSIDES-DOCUSATE SODIUM 8.6-50 MG PO TABS
2.0000 | ORAL_TABLET | ORAL | Status: DC
  Administered 2017-04-20 – 2017-04-21 (×2): 2 via ORAL
  Filled 2017-04-20 (×2): qty 2

## 2017-04-20 MED ORDER — SIMETHICONE 80 MG PO CHEW
80.0000 mg | CHEWABLE_TABLET | ORAL | Status: DC
  Administered 2017-04-20 – 2017-04-21 (×2): 80 mg via ORAL
  Filled 2017-04-20 (×2): qty 1

## 2017-04-20 MED ORDER — DIPHENHYDRAMINE HCL 50 MG/ML IJ SOLN: 12.5000 mg | INTRAMUSCULAR | Status: DC | PRN

## 2017-04-20 MED ORDER — OXYCODONE HCL 5 MG/5ML PO SOLN: 5.0000 mg | Freq: Once | ORAL | Status: DC | PRN

## 2017-04-20 MED ORDER — LACTATED RINGERS IV SOLN
INTRAVENOUS | Status: DC
  Administered 2017-04-20 (×2): via INTRAVENOUS

## 2017-04-20 MED ORDER — CEFAZOLIN SODIUM-DEXTROSE 2-4 GM/100ML-% IV SOLN
2.0000 g | INTRAVENOUS | Status: AC
  Administered 2017-04-20: 2 g via INTRAVENOUS

## 2017-04-20 MED ORDER — COCONUT OIL OIL: 1.0000 "application " | TOPICAL_OIL | Status: DC | PRN

## 2017-04-20 MED ORDER — DIBUCAINE 1 % RE OINT: 1.0000 "application " | TOPICAL_OINTMENT | RECTAL | Status: DC | PRN

## 2017-04-20 MED ORDER — IBUPROFEN 600 MG PO TABS
600.0000 mg | ORAL_TABLET | Freq: Four times a day (QID) | ORAL | Status: DC
  Administered 2017-04-20 – 2017-04-22 (×8): 600 mg via ORAL
  Filled 2017-04-20 (×8): qty 1

## 2017-04-20 MED ORDER — FAMOTIDINE 20 MG PO TABS
20.0000 mg | ORAL_TABLET | Freq: Once | ORAL | Status: AC
  Administered 2017-04-20: 20 mg via ORAL
  Filled 2017-04-20: qty 1

## 2017-04-20 SURGICAL SUPPLY — 30 items
CHLORAPREP W/TINT 26ML (MISCELLANEOUS) ×3 IMPLANT
CLAMP CORD UMBIL (MISCELLANEOUS) IMPLANT
CLOTH BEACON ORANGE TIMEOUT ST (SAFETY) ×3 IMPLANT
DERMABOND ADVANCED (GAUZE/BANDAGES/DRESSINGS) ×2
DERMABOND ADVANCED .7 DNX12 (GAUZE/BANDAGES/DRESSINGS) ×1 IMPLANT
DRSG OPSITE POSTOP 4X10 (GAUZE/BANDAGES/DRESSINGS) ×3 IMPLANT
ELECT REM PT RETURN 9FT ADLT (ELECTROSURGICAL) ×3
ELECTRODE REM PT RTRN 9FT ADLT (ELECTROSURGICAL) ×1 IMPLANT
EXTRACTOR VACUUM M CUP 4 TUBE (SUCTIONS) IMPLANT
EXTRACTOR VACUUM M CUP 4' TUBE (SUCTIONS)
GLOVE BIO SURGEON STRL SZ 6.5 (GLOVE) ×2 IMPLANT
GLOVE BIO SURGEONS STRL SZ 6.5 (GLOVE) ×1
GLOVE BIOGEL PI IND STRL 7.0 (GLOVE) ×2 IMPLANT
GLOVE BIOGEL PI INDICATOR 7.0 (GLOVE) ×4
GOWN STRL REUS W/TWL LRG LVL3 (GOWN DISPOSABLE) ×6 IMPLANT
KIT ABG SYR 3ML LUER SLIP (SYRINGE) IMPLANT
NEEDLE HYPO 25X5/8 SAFETYGLIDE (NEEDLE) IMPLANT
NS IRRIG 1000ML POUR BTL (IV SOLUTION) ×3 IMPLANT
PACK C SECTION WH (CUSTOM PROCEDURE TRAY) ×3 IMPLANT
PAD OB MATERNITY 4.3X12.25 (PERSONAL CARE ITEMS) ×3 IMPLANT
PENCIL SMOKE EVAC W/HOLSTER (ELECTROSURGICAL) ×3 IMPLANT
SUT CHROMIC 0 CT 802H (SUTURE) IMPLANT
SUT CHROMIC 0 CTX 36 (SUTURE) ×9 IMPLANT
SUT MON AB-0 CT1 36 (SUTURE) ×3 IMPLANT
SUT PDS AB 0 CTX 60 (SUTURE) ×3 IMPLANT
SUT PLAIN 0 NONE (SUTURE) IMPLANT
SUT VIC AB 4-0 KS 27 (SUTURE) IMPLANT
SYR BULB 3OZ (MISCELLANEOUS) ×3 IMPLANT
TOWEL OR 17X24 6PK STRL BLUE (TOWEL DISPOSABLE) ×3 IMPLANT
TRAY FOLEY BAG SILVER LF 14FR (SET/KITS/TRAYS/PACK) IMPLANT

## 2017-04-20 NOTE — Anesthesia Procedure Notes (Signed)
Spinal  Patient location during procedure: OR Start time: 04/20/2017 1:00 PM End time: 04/20/2017 1:10 PM Staffing Anesthesiologist: Murvin Natal, MD Performed: anesthesiologist  Preanesthetic Checklist Completed: patient identified, surgical consent, pre-op evaluation, timeout performed, IV checked, risks and benefits discussed and monitors and equipment checked Spinal Block Patient position: sitting Prep: DuraPrep Patient monitoring: cardiac monitor, continuous pulse ox and blood pressure Approach: midline Location: L4-5 Injection technique: single-shot Needle Needle type: Pencan  Needle gauge: 24 G Needle length: 9 cm Assessment Sensory level: T10 Additional Notes Functioning IV was confirmed and monitors were applied. Sterile prep and drape, including hand hygiene and sterile gloves were used. The patient was positioned and the spine was prepped. The skin was anesthetized with lidocaine.  Free flow of clear CSF was obtained prior to injecting local anesthetic into the CSF.  The spinal needle aspirated freely following injection.  The needle was carefully withdrawn.  The patient tolerated the procedure well.

## 2017-04-20 NOTE — Anesthesia Postprocedure Evaluation (Signed)
Anesthesia Post Note  Patient: Tashima Scarpulla  Procedure(s) Performed: CESAREAN SECTION (N/A )     Patient location during evaluation: PACU Anesthesia Type: Spinal Level of consciousness: oriented and awake and alert Pain management: pain level controlled Vital Signs Assessment: post-procedure vital signs reviewed and stable Respiratory status: spontaneous breathing, respiratory function stable and patient connected to nasal cannula oxygen Cardiovascular status: blood pressure returned to baseline and stable Postop Assessment: no headache, no backache and no apparent nausea or vomiting Anesthetic complications: no    Last Vitals:  Vitals:   04/20/17 1521 04/20/17 1627  BP: 115/69   Pulse: (!) 54   Resp: 18 20  Temp: 36.6 C   SpO2: 98%     Last Pain:  Vitals:   04/20/17 1627  TempSrc: Oral  PainSc:    Pain Goal:                 Karyl Kinnier Chyane Greer

## 2017-04-20 NOTE — Anesthesia Preprocedure Evaluation (Addendum)
Anesthesia Evaluation  Patient identified by MRN, date of birth, ID band Patient awake    Reviewed: Allergy & Precautions, NPO status , Patient's Chart, lab work & pertinent test results  Airway Mallampati: II  TM Distance: >3 FB Neck ROM: Full    Dental no notable dental hx.    Pulmonary neg pulmonary ROS,    Pulmonary exam normal breath sounds clear to auscultation       Cardiovascular negative cardio ROS Normal cardiovascular exam Rhythm:Regular Rate:Normal     Neuro/Psych PSYCHIATRIC DISORDERS Depression negative neurological ROS     GI/Hepatic negative GI ROS, Neg liver ROS,   Endo/Other  negative endocrine ROS  Renal/GU negative Renal ROS     Musculoskeletal negative musculoskeletal ROS (+)   Abdominal   Peds  Hematology  (+) anemia ,   Anesthesia Other Findings placenta previa  Reproductive/Obstetrics (+) Pregnancy                            Anesthesia Physical Anesthesia Plan  ASA: II  Anesthesia Plan: Spinal   Post-op Pain Management:    Induction:   PONV Risk Score and Plan: 2 and Ondansetron, Treatment may vary due to age or medical condition and Scopolamine patch - Pre-op  Airway Management Planned: Natural Airway  Additional Equipment:   Intra-op Plan:   Post-operative Plan:   Informed Consent: I have reviewed the patients History and Physical, chart, labs and discussed the procedure including the risks, benefits and alternatives for the proposed anesthesia with the patient or authorized representative who has indicated his/her understanding and acceptance.   Dental advisory given  Plan Discussed with: CRNA  Anesthesia Plan Comments:         Anesthesia Quick Evaluation

## 2017-04-20 NOTE — Op Note (Signed)
Cesarean Section Procedure Note   Michelle Lang  04/20/2017  Indications: Scheduled Proceedure/Maternal Request   Pre-operative Diagnosis: placenta previa.   Post-operative Diagnosis: Same   Surgeon: Juliann Mule) and Role:    * Marylynn Pearson, MD - Primary    Linda Hedges, DO - Assisting   Assistants: Linda Hedges, DO  Anesthesia: spinal   Procedure Details:  The patient was seen in the Holding Room. The risks, benefits, complications, treatment options, and expected outcomes were discussed with the patient. The patient concurred with the proposed plan, giving informed consent. identified as Michelle Lang and the procedure verified as C-Section Delivery. A Time Out was held and the above information confirmed.  After induction of anesthesia, the patient was draped and prepped in the usual sterile manner. A transverse was made and carried down through the subcutaneous tissue to the fascia. Fascial incision was made and extended transversely. The fascia was separated from the underlying rectus tissue superiorly and inferiorly. The peritoneum was identified and entered. Peritoneal incision was extended longitudinally. The utero-vesical peritoneal reflection was incised transversely and the bladder flap was bluntly freed from the lower uterine segment. A low transverse uterine incision was made. Delivered from cephalic presentation was a viable female infant. Cord ph was not sent the umbilical cord was clamped and cut cord blood was obtained for evaluation. The placenta was removed Intact and appeared normal. The uterine outline, tubes and ovaries appeared normal}. The uterine incision was closed with running locked sutures of 0chromic gut.   Hemostasis was observed. Lavage was carried out until clear. The fascia was then reapproximated with running sutures of 0PDS.  The skin was closed with 4-0Vicryl.   Instrument, sponge, and needle counts were correct prior the abdominal closure and were correct at  the conclusion of the case.     Estimated Blood Loss: 752 mL   Urine Output: clear  Specimens: placenta   Complications: no complications  Disposition: PACU - hemodynamically stable.   Maternal Condition: stable   Baby condition / location:  Couplet care / Skin to Skin  Attending Attestation: I was present and scrubbed for the entire procedure.   Signed: Surgeon(s): Marylynn Pearson, MD Linda Hedges, DO

## 2017-04-20 NOTE — Transfer of Care (Signed)
Immediate Anesthesia Transfer of Care Note  Patient: Michelle Lang  Procedure(s) Performed: CESAREAN SECTION (N/A )  Patient Location: PACU  Anesthesia Type:Spinal  Level of Consciousness: awake, alert  and oriented  Airway & Oxygen Therapy: Patient Spontanous Breathing  Post-op Assessment: Report given to RN and Post -op Vital signs reviewed and stable  Post vital signs: Reviewed and stable  Last Vitals:  Vitals:   04/20/17 1139  BP: 114/78  Pulse: 73  Resp: 16  Temp: 36.8 C    Last Pain:  Vitals:   04/20/17 1139  TempSrc: Oral         Complications: No apparent anesthesia complications

## 2017-04-21 ENCOUNTER — Encounter (HOSPITAL_COMMUNITY): Payer: Self-pay | Admitting: *Deleted

## 2017-04-21 LAB — CBC
HCT: 30.3 % — ABNORMAL LOW (ref 36.0–46.0)
HEMOGLOBIN: 10.4 g/dL — AB (ref 12.0–15.0)
MCH: 31.2 pg (ref 26.0–34.0)
MCHC: 34.3 g/dL (ref 30.0–36.0)
MCV: 91 fL (ref 78.0–100.0)
PLATELETS: 165 10*3/uL (ref 150–400)
RBC: 3.33 MIL/uL — ABNORMAL LOW (ref 3.87–5.11)
RDW: 16.5 % — ABNORMAL HIGH (ref 11.5–15.5)
WBC: 12.9 10*3/uL — ABNORMAL HIGH (ref 4.0–10.5)

## 2017-04-21 NOTE — Lactation Note (Signed)
This note was copied from a baby's chart. Lactation Consultation Note Baby 58 hrs old. Rooting, given to mom. Mom states baby has latched earlier, nipple had pinched appearance. No bruising noted.. Mom has large everted nipples. Positioned baby in football position, latched w/o difficulty. Mom denied painful latch.  Discussed newborn behavior, feeding habits, STS, I&O, cluster feeding, supply and demand. Mom encouraged to feed baby 8-12 times/24 hours and with feeding cues.  Mom has colostrum, stated she knows how to hand express. Discussed support, comfort body alignment while feeding. Assessed baby's suck w/gloved finger, baby didn't extend tongue under finger, LC did suck training to extended tongue, would do it briefly and suckle good, then draw it back. Baby can extend tongue past gum line but not outside of mouth at this time.  Encouraged to do chin tug if pinching.  Encouraged to call for assistance if needed or has questions. [place] brochure given w/resources, support groups and [place] services.  Patient Name: Michelle Lang [id number] Date: 04/21/2017 Reason for consult: Initial assessment;Early term 37-38.6wks   Maternal Data Has patient been taught Hand Expression?: Yes Does the patient have breastfeeding experience prior to this delivery?: No  Feeding Feeding Type: Breast Fed  LATCH Score Latch: Grasps breast easily, tongue down, lips flanged, rhythmical sucking.  Audible Swallowing: A few with stimulation  Type of Nipple: Everted at rest and after stimulation  Comfort (Breast/Nipple): Soft / non-tender  Hold (Positioning): Assistance needed to correctly position infant at breast and maintain latch.  LATCH Score: 8  Interventions Interventions: Breast feeding basics reviewed;Breast compression;Assisted with latch;Adjust position;Support pillows;Skin to skin;Breast massage;Position options;Hand express;Expressed milk  Lactation Tools Discussed/Used WIC Program:  No   Consult Status Consult Status: Follow-up Date: 04/22/17 Follow-up type: In-patient    Theodoro Kalata 04/21/2017, 5:32 AM

## 2017-04-21 NOTE — Progress Notes (Signed)
Subjective: Postpartum Day 1: Cesarean Delivery Patient reports tolerating PO.  Newborn very sleepy and needing to be woken to feed. Desires circ tomorrow.  Objective: Vital signs in last 24 hours: Temp:  [97.7 F (36.5 C)-99.5 F (37.5 C)] 98.9 F (37.2 C) (02/21 0625) Pulse Rate:  [50-73] 61 (02/21 0625) Resp:  [14-20] 16 (02/21 0625) BP: (95-119)/(50-97) 103/50 (02/21 0625) SpO2:  [95 %-100 %] 96 % (02/20 2247) Weight:  [164 lb 9.6 oz (74.7 kg)] 164 lb 9.6 oz (74.7 kg) (02/20 1117)  Physical Exam:  General: alert, cooperative and appears stated age Lochia: appropriate Uterine Fundus: firm Incision: healing well, no significant drainage DVT Evaluation: No evidence of DVT seen on physical exam. Negative Homan's sign. No cords or calf tenderness.  Recent Labs    04/19/17 1045 04/21/17 0504  HGB 11.6* 10.4*  HCT 33.9* 30.3*    Assessment/Plan: Status post Cesarean section. Doing well postoperatively.  Continue current care. Plan circ tomorrow.  Aidynn Polendo, [place] 04/21/2017, 8:36 AM

## 2017-04-21 NOTE — Anesthesia Postprocedure Evaluation (Signed)
Anesthesia Post Note  Patient: Michelle Lang  Procedure(s) Performed: CESAREAN SECTION (N/A )     Patient location during evaluation: Mother Baby Anesthesia Type: Spinal Level of consciousness: awake Pain management: pain level controlled Vital Signs Assessment: post-procedure vital signs reviewed and stable Respiratory status: spontaneous breathing Cardiovascular status: stable Postop Assessment: spinal receding and patient able to bend at knees Anesthetic complications: no    Last Vitals:  Vitals:   04/21/17 0249 04/21/17 0625  BP: (!) 112/55 (!) 103/50  Pulse: 60 61  Resp: 16 16  Temp: 37.4 C 37.2 C  SpO2:      Last Pain:  Vitals:   04/21/17 0634  TempSrc:   PainSc: 8    Pain Goal:                 Everette Rank

## 2017-04-21 NOTE — Progress Notes (Signed)
CSW acknowledges consult.  CSW attempted to meet with [name], however [name] had several room guest.  CSW will attempt to visit with [name] at a later time.   Kyarah Enamorado Boyd-Gilyard, MSW, LCSW Clinical Social Work [phone]  

## 2017-04-21 NOTE — Addendum Note (Signed)
Addendum  created 04/21/17 0741 by Georgeanne Nim, CRNA   Sign clinical note

## 2017-04-22 LAB — BIRTH TISSUE RECOVERY COLLECTION (PLACENTA DONATION)

## 2017-04-22 MED ORDER — IBUPROFEN 600 MG PO TABS: 600.0000 mg | ORAL_TABLET | Freq: Four times a day (QID) | ORAL | 0 refills | Status: DC

## 2017-04-22 NOTE — Lactation Note (Signed)
This note was copied from a baby's chart. Lactation Consultation Note  Patient Name: Michelle Lang [id number] Date: 04/22/2017  Mom feels feedings are going well.  Nipples tender but latch scores good.  [employer] pump given.  Lactation outpatient services and support reviewed and encouraged prn.   Maternal Data    Feeding Feeding Type: Breast Fed  LATCH Score Latch: Grasps breast easily, tongue down, lips flanged, rhythmical sucking.  Audible Swallowing: A few with stimulation  Type of Nipple: Everted at rest and after stimulation  Comfort (Breast/Nipple): Filling, red/small blisters or bruises, mild/mod discomfort  Hold (Positioning): No assistance needed to correctly position infant at breast.  LATCH Score: 8  Interventions    Lactation Tools Discussed/Used     Consult Status      Ave Filter 04/22/2017, 9:05 AM

## 2017-04-22 NOTE — Discharge Summary (Signed)
Obstetric Discharge Summary Reason for Admission: cesarean section Prenatal Procedures: none Intrapartum Procedures: cesarean: low cervical, transverse Postpartum Procedures: none Complications-Operative and Postpartum: none Hemoglobin  Date Value Ref Range Status  04/21/2017 10.4 (L) 12.0 - 15.0 g/dL Final   HCT  Date Value Ref Range Status  04/21/2017 30.3 (L) 36.0 - 46.0 % Final    Physical Exam:  General: alert, cooperative and appears stated age 32: appropriate Uterine Fundus: firm Incision: healing well, no significant drainage, no dehiscence DVT Evaluation: No evidence of DVT seen on physical exam.  Discharge Diagnoses: Term Pregnancy-delivered  Discharge Information: Date: 04/22/2017 Activity: pelvic rest Diet: routine Medications: Ibuprofen Condition: stable Instructions: refer to practice specific booklet Discharge to: home   Newborn Data: Live born female  Birth Weight: 7 lb 7.6 oz (3390 g) APGAR: 8, 9  Newborn Delivery   Birth date/time:  04/20/2017 13:26:00 Delivery type:  C-Section, Low Transverse C-section categorization:  Primary     Home with mother.  Michelle Lang L 04/22/2017, 8:28 AM

## 2017-04-22 NOTE — Progress Notes (Signed)
CSW received consult for hx of Anxiety and Depression.  CSW met with [name] to offer support and complete assessment.    When CSW arrived, [name] was resting in bed and FOB/Husband was in the recliner bonding with infant.  CSW explained CSW role and [name] gave CSW permission to complete the assessment while FOB was present. The couple was polite, easy to engage, and receptive to meeting with CSW.   CSW inquired about [name]'s seasonal depression.  [name] acknowledged the seasonal depression and reported being dx a few years ago (more than 3 years).  [name] also shared that [name] symptoms are typically controlled with daily exercise and other self care activities. [name] denied having any signs and symptoms over the past 3 years.   CSW provided education regarding the baby blues period vs. perinatal mood disorders, discussed treatment and gave resources for mental health follow up if concerns arise.  CSW recommends self-evaluation during the postpartum time period using the New Mom Checklist from Postpartum Progress and encouraged [name] to contact a medical professional if symptoms are noted at any time.  [name] reported a good support team ([name]'s mother, FOB's sister, and a host of local friends).  FOB also stated that he will be on maternity leave for 6 weeks to assist [name] with baby.   CSW provided review of Sudden Infant Death Syndrome (SIDS) precautions.    CSW identifies no further need for intervention and no barriers to discharge at this time.  Mandie Crabbe Boyd-Gilyard, MSW, LCSW Clinical Social Work [phone]   

## 2017-04-22 NOTE — Progress Notes (Signed)
With Motrin pass this AM, RN asked pt how her pain was. Pt states when she is laying in bed her pain is a 0/10 or a 1/10 at the incision site. Pt also mentions that she can barely stand (and barely sustain standing/walking) due to the pain becoming so severe. RN reinforced the availability of PRN pain medications (last given over 15 hours ago). RN educated pt on the importance of walking/movement for the recovery process as well. Pt states understanding. Pt still does not want other pain intervention at this time, other than rest. Pt understands to call out for pain medication when she wants it.

## 2017-04-23 LAB — TYPE AND SCREEN
ABO/RH(D): A POS
Antibody Screen: NEGATIVE
UNIT DIVISION: 0
Unit division: 0

## 2017-04-23 LAB — BPAM RBC
BLOOD PRODUCT EXPIRATION DATE: 201903072359
Blood Product Expiration Date: 201903072359
ISSUE DATE / TIME: 201902191341
ISSUE DATE / TIME: 201902191341
UNIT TYPE AND RH: 6200
Unit Type and Rh: 6200

## 2017-04-26 MED FILL — TRIAMCINOLONE 0.1% OINTMENT: 0.1 | 30 days supply | Qty: 80 | Fill #0

## 2017-04-27 ENCOUNTER — Encounter (HOSPITAL_COMMUNITY): Payer: Self-pay | Admitting: *Deleted

## 2017-05-31 DIAGNOSIS — Z1389 Encounter for screening for other disorder: Secondary | ICD-10-CM | POA: Diagnosis not present

## 2017-06-08 DIAGNOSIS — N760 Acute vaginitis: Secondary | ICD-10-CM | POA: Diagnosis not present

## 2017-06-14 ENCOUNTER — Other Ambulatory Visit: Payer: Self-pay | Admitting: Pharmacist

## 2017-06-14 MED ORDER — ADALIMUMAB 40 MG/0.4ML ~~LOC~~ AJKT: 0.8000 mL | AUTO-INJECTOR | SUBCUTANEOUS | 4 refills | Status: DC

## 2017-06-15 MED FILL — HUMIRA PEN 40 MG/0.4ML PNKT: 40 | 28 days supply | Qty: 2 | Fill #0

## 2017-07-14 MED FILL — HUMIRA PEN 40 MG/0.4ML PNKT: 40 | 28 days supply | Qty: 2 | Fill #1

## 2017-08-10 MED FILL — HUMIRA PEN 40 MG/0.4ML PNKT: 40 | 28 days supply | Qty: 2 | Fill #2

## 2017-09-08 MED FILL — HUMIRA PEN 40 MG/0.4ML PNKT: 40 | 28 days supply | Qty: 2 | Fill #3

## 2017-09-29 DIAGNOSIS — D1801 Hemangioma of skin and subcutaneous tissue: Secondary | ICD-10-CM | POA: Diagnosis not present

## 2017-09-29 DIAGNOSIS — L700 Acne vulgaris: Secondary | ICD-10-CM | POA: Diagnosis not present

## 2017-09-29 DIAGNOSIS — D2271 Melanocytic nevi of right lower limb, including hip: Secondary | ICD-10-CM | POA: Diagnosis not present

## 2017-09-29 DIAGNOSIS — D485 Neoplasm of uncertain behavior of skin: Secondary | ICD-10-CM | POA: Diagnosis not present

## 2017-09-29 DIAGNOSIS — D225 Melanocytic nevi of trunk: Secondary | ICD-10-CM | POA: Diagnosis not present

## 2017-10-07 MED FILL — HUMIRA PEN 40 MG/0.4ML PNKT: 40 | 28 days supply | Qty: 2 | Fill #4

## 2017-10-26 DIAGNOSIS — M0609 Rheumatoid arthritis without rheumatoid factor, multiple sites: Secondary | ICD-10-CM | POA: Diagnosis not present

## 2017-10-26 DIAGNOSIS — Z6822 Body mass index (BMI) 22.0-22.9, adult: Secondary | ICD-10-CM | POA: Diagnosis not present

## 2017-11-15 ENCOUNTER — Other Ambulatory Visit: Payer: Self-pay | Admitting: Pharmacist

## 2017-11-15 MED ORDER — ADALIMUMAB 40 MG/0.4ML ~~LOC~~ AJKT: 0.4000 mL | AUTO-INJECTOR | SUBCUTANEOUS | 5 refills | Status: DC

## 2017-11-15 MED ORDER — ADALIMUMAB 40 MG/0.4ML ~~LOC~~ AJKT: 0.8000 mL | AUTO-INJECTOR | SUBCUTANEOUS | 5 refills | Status: DC

## 2017-11-15 MED FILL — HUMIRA PEN 40 MG/0.4ML PNKT: 40 | 28 days supply | Qty: 2 | Fill #0

## 2017-12-13 MED FILL — HUMIRA PEN 40 MG/0.4ML PNKT: 40 | 28 days supply | Qty: 2 | Fill #1

## 2018-01-12 MED FILL — HUMIRA PEN 40 MG/0.4ML PNKT: 40 | 28 days supply | Qty: 2 | Fill #2

## 2018-02-07 MED FILL — HUMIRA PEN 40 MG/0.4ML PNKT: 40 | 28 days supply | Qty: 2 | Fill #3

## 2018-03-09 MED FILL — HUMIRA PEN 40 MG/0.4ML PNKT: 40 | 28 days supply | Qty: 2 | Fill #4

## 2018-03-24 ENCOUNTER — Encounter: Payer: Self-pay | Admitting: Family Medicine

## 2018-03-24 ENCOUNTER — Ambulatory Visit (INDEPENDENT_AMBULATORY_CARE_PROVIDER_SITE_OTHER): Payer: Self-pay | Admitting: Family Medicine

## 2018-03-24 VITALS — BP 95/70 | HR 63 | Temp 98.3°F | Resp 16 | Wt 126.8 lb

## 2018-03-24 DIAGNOSIS — J029 Acute pharyngitis, unspecified: Secondary | ICD-10-CM

## 2018-03-24 NOTE — Progress Notes (Signed)
Michelle Lang is a 33 y.o. female who presents today with 14 days of sore throat after a URI, she has attempted over the counter treatments and this has not resolved symptoms but they have no worsened by still persist. Denies smoking or asthma does have seasonal allergies that are intermittently treated with claritin for many years. Does have pets cats that she may have a sensitivity to as well. Recent URI that resulted in sore throat and laryngitis and now just persistent pain with swallowing.    Review of Systems  Constitutional: Negative for chills, fever and malaise/fatigue.  HENT: Positive for sore throat. Negative for congestion, ear discharge, ear pain and sinus pain.   Eyes: Negative.   Respiratory: Negative for cough, sputum production and shortness of breath.   Cardiovascular: Negative.  Negative for chest pain.  Gastrointestinal: Negative for abdominal pain, diarrhea, nausea and vomiting.  Genitourinary: Negative for dysuria, frequency, hematuria and urgency.  Musculoskeletal: Negative for myalgias.  Skin: Negative.   Neurological: Negative for headaches.  Endo/Heme/Allergies: Negative.   Psychiatric/Behavioral: Negative.     [place] has a current medication list which includes the following prescription(s): adalimumab, loratadine, multivitamin-prenatal, ibuprofen, and magnesium oxide. Also is allergic to other.  Debroh  has a past medical history of Depression and Inflammatory arthritis (2014). Also  has a past surgical history that includes Appendectomy (2003); Wisdom tooth extraction; and Cesarean section (N/A, 04/20/2017).    O: Vitals:   03/24/18 0940  BP: 95/70  Pulse: 63  Resp: 16  Temp: 98.3 F (36.8 C)  SpO2: 98%     Physical Exam Vitals signs reviewed.  Constitutional:      Appearance: She is well-developed. She is not toxic-appearing.  HENT:     Head: Normocephalic.     Salivary Glands: Right salivary gland is not diffusely enlarged or tender. Left salivary gland  is not diffusely enlarged or tender.     Right Ear: Hearing, tympanic membrane, ear canal and external ear normal.     Left Ear: Hearing, tympanic membrane, ear canal and external ear normal.     Nose: Nose normal.     Mouth/Throat:     Pharynx: Uvula midline. Posterior oropharyngeal erythema present. No pharyngeal swelling, oropharyngeal exudate or uvula swelling.     Tonsils: No tonsillar exudate or tonsillar abscesses. Swelling: 0 on the right. 0 on the left.  Neck:     Musculoskeletal: Normal range of motion and neck supple.  Cardiovascular:     Rate and Rhythm: Normal rate and regular rhythm.     Pulses: Normal pulses.     Heart sounds: Normal heart sounds.  Pulmonary:     Effort: Pulmonary effort is normal.     Breath sounds: Normal breath sounds.  Abdominal:     General: Bowel sounds are normal.     Palpations: Abdomen is soft.  Musculoskeletal: Normal range of motion.  Lymphadenopathy:     Head:     Right side of head: No submental, submandibular or tonsillar adenopathy.     Left side of head: Tonsillar adenopathy present. No submental or submandibular adenopathy.     Cervical: No cervical adenopathy.  Neurological:     Mental Status: She is alert and oriented to person, place, and time.    A: 1. Sore throat    P: 1. Sore throat Discussed likely viral etiology- due to overwhelming normal unremarkable exam- although patient is experiencing discomfort. Recommendations included supportive care , warm salt water rinses, tea, honey, lemon, warm  liquids, throat lozenges and tylenol/motrin PRN Discussed follow up with OB for chronic allergy medication regimen review (due to lactating and milk supply concerns) Discussed increased risk due to environmental allergens (recent move, older home, pet allergy) Discussed monitoring for fever, change, or worsening symptoms may consider abx if symptoms not improved.  Discussed with patient exam findings, suspected diagnosis etiology and   reviewed recommended treatment plan and follow up, including complications and indications for urgent medical follow up and evaluation. Medications including use and indications reviewed with patient. Patient provided relevant patient education on diagnosis and/or relevant related condition that were discussed and reviewed with patient at discharge. Patient verbalized understanding of information provided and agrees with plan of care (POC), all questions answered.

## 2018-03-24 NOTE — Patient Instructions (Signed)
Sore Throat  Discussed likely viral- supportive care , warm salt water rinses, tea, honey, lemon, warm liquids, throat lozenges and tylenol/motrin PRN Discussed follow up with OB for chronic allergy medication regimen review Discussed increased risk due to environmental allergens (recent move, older home, pet allergy) Discussed monitoring for fever, change, or worsening symptoms may consider abx if symptoms not improved   A sore throat is pain, burning, irritation, or scratchiness in the throat. When you have a sore throat, you may feel pain or tenderness in your throat when you swallow or talk. Many things can cause a sore throat, including:  An infection.  Seasonal allergies.  Dryness in the air.  Irritants, such as smoke or pollution.  Radiation treatment to the area.  Gastroesophageal reflux disease (GERD).  A tumor. A sore throat is often the first sign of another sickness. It may happen with other symptoms, such as coughing, sneezing, fever, and swollen neck glands. Most sore throats go away without medical treatment. Follow these instructions at home:      Take over-the-counter medicines only as told by your health care provider. ? If your child has a sore throat, do not give your child aspirin because of the association with Reye syndrome.  Drink enough fluids to keep your urine pale yellow.  Rest as needed.  To help with pain, try: ? Sipping warm liquids, such as broth, herbal tea, or warm water. ? Eating or drinking cold or frozen liquids, such as frozen ice pops. ? Gargling with a salt-water mixture 3-4 times a day or as needed. To make a salt-water mixture, completely dissolve -1 tsp (3-6 g) of salt in 1 cup (237 mL) of warm water. ? Sucking on hard candy or throat lozenges. ? Putting a cool-mist humidifier in your bedroom at night to moisten the air. ? Sitting in the bathroom with the door closed for 5-10 minutes while you run hot water in the shower.  Do  not use any products that contain nicotine or tobacco, such as cigarettes, e-cigarettes, and chewing tobacco. If you need help quitting, ask your health care provider.  Wash your hands well and often with soap and water. If soap and water are not available, use hand sanitizer. Contact a health care provider if:  You have a fever for more than 2-3 days.  You have symptoms that last (are persistent) for more than 2-3 days.  Your throat does not get better within 7 days.  You have a fever and your symptoms suddenly get worse.  Your child who is 3 months to 50 years old has a temperature of 102.26F (39C) or higher. Get help right away if:  You have difficulty breathing.  You cannot swallow fluids, soft foods, or your saliva.  You have increased swelling in your throat or neck.  You have persistent nausea and vomiting. Summary  A sore throat is pain, burning, irritation, or scratchiness in the throat. Many things can cause a sore throat.  Take over-the-counter medicines only as told by your health care provider. Do not give your child aspirin.  Drink plenty of fluids, and rest as needed.  Contact a health care provider if your symptoms worsen or your sore throat does not get better within 7 days. This information is not intended to replace advice given to you by your health care provider. Make sure you discuss any questions you have with your health care provider. Document Released: 03/25/2004 Document Revised: 07/18/2017 Document Reviewed: 07/18/2017 Elsevier Interactive Patient Education  2019 [employer] Pharyngitis  Pharyngitis is redness, pain, and swelling (inflammation) of the throat (pharynx). It is a very common cause of sore throat. Pharyngitis can be caused by a bacteria, but it is usually caused by a virus. Most cases of pharyngitis get better on their own without treatment. What are the causes? This condition may be caused by:  Infection by viruses (viral). Viral  pharyngitis spreads from person to person (is contagious) through coughing, sneezing, and sharing of personal items or utensils such as cups, forks, spoons, and toothbrushes.  Infection by bacteria (bacterial). Bacterial pharyngitis may be spread by touching the nose or face after coming in contact with the bacteria, or through more intimate contact, such as kissing.  Allergies. Allergies can cause buildup of mucus in the throat (post-nasal drip), leading to inflammation and irritation. Allergies can also cause blocked nasal passages, forcing breathing through the mouth, which dries and irritates the throat. What increases the risk? You are more likely to develop this condition if:  You are 70-66 years old.  You are exposed to crowded environments such as daycare, school, or dormitory living.  You live in a cold climate.  You have a weakened disease-fighting (immune) system. What are the signs or symptoms? Symptoms of this condition vary by the cause (viral, bacterial, or allergies) and can include:  Sore throat.  Fatigue.  Low-grade fever.  Headache.  Joint pain and muscle aches.  Skin rashes.  Swollen glands in the throat (lymph nodes).  Plaque-like film on the throat or tonsils. This is often a symptom of bacterial pharyngitis.  Vomiting.  Stuffy nose (nasal congestion).  Cough.  Red, itchy eyes (conjunctivitis).  Loss of appetite. How is this diagnosed? This condition is often diagnosed based on your medical history and a physical exam. Your health care provider will ask you questions about your illness and your symptoms. A swab of your throat may be done to check for bacteria (rapid strep test). Other lab tests may also be done, depending on the suspected cause, but these are rare. How is this treated? This condition usually gets better in 3-4 days without medicine. Bacterial pharyngitis may be treated with antibiotic medicines. Follow these instructions at  home:  Take over-the-counter and prescription medicines only as told by your health care provider. ? If you were prescribed an antibiotic medicine, take it as told by your health care provider. Do not stop taking the antibiotic even if you start to feel better. ? Do not give children aspirin because of the association with Reye syndrome.  Drink enough water and fluids to keep your urine clear or pale yellow.  Get a lot of rest.  Gargle with a salt-water mixture 3-4 times a day or as needed. To make a salt-water mixture, completely dissolve -1 tsp of salt in 1 cup of warm water.  If your health care provider approves, you may use throat lozenges or sprays to soothe your throat. Contact a health care provider if:  You have large, tender lumps in your neck.  You have a rash.  You cough up green, yellow-brown, or bloody spit. Get help right away if:  Your neck becomes stiff.  You drool or are unable to swallow liquids.  You cannot drink or take medicines without vomiting.  You have severe pain that does not go away, even after you take medicine.  You have trouble breathing, and it is not caused by a stuffy nose.  You have new pain and swelling in  your joints such as the knees, ankles, wrists, or elbows. Summary  Pharyngitis is redness, pain, and swelling (inflammation) of the throat (pharynx).  While pharyngitis can be caused by a bacteria, the most common causes are viral.  Most cases of pharyngitis get better on their own without treatment.  Bacterial pharyngitis is treated with antibiotic medicines. This information is not intended to replace advice given to you by your health care provider. Make sure you discuss any questions you have with your health care provider. Document Released: 02/15/2005 Document Revised: 03/23/2016 Document Reviewed: 03/23/2016 Elsevier Interactive Patient Education  2019 [employer].

## 2018-04-18 MED FILL — HUMIRA PEN 40 MG/0.4ML PNKT: 40 | 28 days supply | Qty: 2 | Fill #5

## 2018-05-17 ENCOUNTER — Other Ambulatory Visit: Payer: Self-pay | Admitting: Pharmacist

## 2018-05-17 MED ORDER — ADALIMUMAB 40 MG/0.4ML ~~LOC~~ AJKT: 0.4000 mL | AUTO-INJECTOR | SUBCUTANEOUS | 4 refills | Status: DC

## 2018-05-17 MED FILL — HUMIRA PEN 40 MG/0.4ML PNKT: 40 | 28 days supply | Qty: 2 | Fill #0 | Status: TO

## 2018-06-05 MED FILL — HUMIRA PEN 40 MG/0.4ML PNKT: 40 | 28 days supply | Qty: 2 | Fill #0

## 2018-06-19 ENCOUNTER — Encounter: Payer: Self-pay | Admitting: Pharmacist

## 2018-06-19 ENCOUNTER — Other Ambulatory Visit: Payer: Self-pay

## 2018-06-19 ENCOUNTER — Ambulatory Visit (INDEPENDENT_AMBULATORY_CARE_PROVIDER_SITE_OTHER): Payer: Self-pay | Admitting: Pharmacist

## 2018-06-19 DIAGNOSIS — Z79899 Other long term (current) drug therapy: Secondary | ICD-10-CM

## 2018-06-19 NOTE — Progress Notes (Signed)
   S: Patient presents today for review of her specialty medication.  Patient is currently on Humira for rheumatoid arthritis. Patient is managed by [facility] for this.   Efficacy: continues on Humira without adverse effects.  Dosing:  Rheumatoid arthritis: SubQ: 40 mg every other week (may continue methotrexate, other nonbiologic DMARDS, corticosteroids, NSAIDs, and/or analgesics); patients not taking concomitant methotrexate may increase dose to 40 mg every week  Drug-drug interactions: none  Screening: TB test: completed per patient Hepatitis: completed per patient  Monitoring: S/sx of infection: had viral infection in January - seen by [facility] CBC: regularly monitored by Dr. Melissa Noon office   O:     Lab Results  Component Value Date   WBC 12.9 (H) 04/21/2017   HGB 10.4 (L) 04/21/2017   HCT 30.3 (L) 04/21/2017   MCV 91.0 04/21/2017   PLT 165 04/21/2017      Chemistry   No results found for: NA, K, CL, CO2, BUN, CREATININE, GLU No results found for: CALCIUM, ALKPHOS, AST, ALT, BILITOT   Last labs from 2016 were WNL (reviewed via [facility])  A/P: 1. Medication review: Patient currently holding Humira for rheumatoid arthritis with no adverse effects. Patient has been on Humira previously and has no questions, aware of increased risk of infection and possible increased risk of malignancy and is closely follow by Dr. Amil Amen. No recommendations for any changes.    Christella Hartigan, PharmD, BCPS, BCACP, CPP Clinical Pharmacist Practitioner  [phone]

## 2018-07-13 MED FILL — HUMIRA PEN 40 MG/0.4ML PNKT: 40 | 28 days supply | Qty: 2 | Fill #1

## 2018-08-21 ENCOUNTER — Telehealth: Payer: Self-pay | Admitting: Emergency Medicine

## 2018-08-21 NOTE — Telephone Encounter (Signed)
Appt scheduled

## 2018-08-21 NOTE — Telephone Encounter (Signed)
Yes, I will see her  TJ

## 2018-08-21 NOTE — Telephone Encounter (Signed)
Received call from pt requesting to [facility] with Dr Ronnald Ramp, states her husband Michelle Lang recommended Dr Ronnald Ramp. Please advise if you are willing to accept pt.

## 2018-08-28 MED FILL — HUMIRA PEN 40 MG/0.4ML PNKT: 40 | 28 days supply | Qty: 2 | Fill #2

## 2018-09-07 ENCOUNTER — Other Ambulatory Visit (INDEPENDENT_AMBULATORY_CARE_PROVIDER_SITE_OTHER): Payer: No Typology Code available for payment source

## 2018-09-07 ENCOUNTER — Encounter: Payer: Self-pay | Admitting: Internal Medicine

## 2018-09-07 ENCOUNTER — Other Ambulatory Visit: Payer: Self-pay

## 2018-09-07 ENCOUNTER — Ambulatory Visit (INDEPENDENT_AMBULATORY_CARE_PROVIDER_SITE_OTHER): Payer: No Typology Code available for payment source | Admitting: Internal Medicine

## 2018-09-07 VITALS — BP 120/80 | HR 64 | Temp 99.4°F | Ht 64.0 in | Wt 125.0 lb

## 2018-09-07 DIAGNOSIS — R10813 Right lower quadrant abdominal tenderness: Secondary | ICD-10-CM | POA: Diagnosis not present

## 2018-09-07 DIAGNOSIS — Z0000 Encounter for general adult medical examination without abnormal findings: Secondary | ICD-10-CM | POA: Diagnosis not present

## 2018-09-07 DIAGNOSIS — R509 Fever, unspecified: Secondary | ICD-10-CM | POA: Diagnosis not present

## 2018-09-07 DIAGNOSIS — R1084 Generalized abdominal pain: Secondary | ICD-10-CM

## 2018-09-07 DIAGNOSIS — D539 Nutritional anemia, unspecified: Secondary | ICD-10-CM | POA: Insufficient documentation

## 2018-09-07 LAB — IBC PANEL
Iron: 170 ug/dL — ABNORMAL HIGH (ref 42–145)
Saturation Ratios: 56.7 % — ABNORMAL HIGH (ref 20.0–50.0)
Transferrin: 214 mg/dL (ref 212.0–360.0)

## 2018-09-07 LAB — VITAMIN B12: Vitamin B-12: 329 pg/mL (ref 211–911)

## 2018-09-07 LAB — BASIC METABOLIC PANEL
BUN: 16 mg/dL (ref 6–23)
CO2: 27 mEq/L (ref 19–32)
Calcium: 9.2 mg/dL (ref 8.4–10.5)
Chloride: 103 mEq/L (ref 96–112)
Creatinine, Ser: 0.76 mg/dL (ref 0.40–1.20)
GFR: 87.61 mL/min (ref >60.00)
Glucose, Bld: 90 mg/dL (ref 70–99)
Potassium: 4.3 mEq/L (ref 3.5–5.1)
Sodium: 137 mEq/L (ref 135–145)

## 2018-09-07 LAB — URINALYSIS, ROUTINE W REFLEX MICROSCOPIC
Bilirubin Urine: NEGATIVE
Hgb urine dipstick: NEGATIVE
Ketones, ur: NEGATIVE
Leukocytes,Ua: NEGATIVE
Nitrite: NEGATIVE
Specific Gravity, Urine: 1.025 (ref 1.000–1.030)
Total Protein, Urine: NEGATIVE
Urine Glucose: NEGATIVE
Urobilinogen, UA: 0.2 (ref 0.0–1.0)
pH: 5.5 (ref 5.0–8.0)

## 2018-09-07 LAB — CBC WITH DIFFERENTIAL/PLATELET
Basophils Absolute: 0.1 10*3/uL (ref 0.0–0.1)
Basophils Relative: 0.6 % (ref 0.0–3.0)
Eosinophils Absolute: 0.4 10*3/uL (ref 0.0–0.7)
Eosinophils Relative: 4.7 % (ref 0.0–5.0)
HCT: 41 % (ref 36.0–46.0)
Hemoglobin: 13.6 g/dL (ref 12.0–15.0)
Lymphocytes Relative: 37.3 % (ref 12.0–46.0)
Lymphs Abs: 3.5 10*3/uL (ref 0.7–4.0)
MCHC: 33.2 g/dL (ref 30.0–36.0)
MCV: 91 fl (ref 78.0–100.0)
Monocytes Absolute: 0.8 10*3/uL (ref 0.1–1.0)
Monocytes Relative: 8.3 % (ref 3.0–12.0)
Neutro Abs: 4.6 10*3/uL (ref 1.4–7.7)
Neutrophils Relative %: 49.1 % (ref 43.0–77.0)
Platelets: 291 10*3/uL (ref 150.0–400.0)
RBC: 4.5 Mil/uL (ref 3.87–5.11)
RDW: 13.3 % (ref 11.5–15.5)
WBC: 9.4 10*3/uL (ref 4.0–10.5)

## 2018-09-07 LAB — HEPATIC FUNCTION PANEL
ALT: 10 U/L (ref 0–35)
AST: 15 U/L (ref 0–37)
Albumin: 4.5 g/dL (ref 3.5–5.2)
Alkaline Phosphatase: 61 U/L (ref 39–117)
Bilirubin, Direct: 0.1 mg/dL (ref 0.0–0.3)
Total Bilirubin: 0.6 mg/dL (ref 0.2–1.2)
Total Protein: 7.2 g/dL (ref 6.0–8.3)

## 2018-09-07 LAB — LIPID PANEL
Cholesterol: 141 mg/dL (ref 0–200)
HDL: 53.4 mg/dL (ref >39.00)
LDL Cholesterol: 73 mg/dL (ref 0–99)
NonHDL: 87.8
Total CHOL/HDL Ratio: 3
Triglycerides: 75 mg/dL (ref 0.0–149.0)
VLDL: 15 mg/dL (ref 0.0–40.0)

## 2018-09-07 LAB — HCG, QUANTITATIVE, PREGNANCY: Quantitative HCG: <0.6 m[IU]/mL

## 2018-09-07 LAB — TSH: TSH: 2.06 u[IU]/mL (ref 0.35–4.50)

## 2018-09-07 LAB — FERRITIN: Ferritin: 20.9 ng/mL (ref 10.0–291.0)

## 2018-09-07 LAB — FOLATE: Folate: 18.4 ng/mL (ref >5.9)

## 2018-09-07 LAB — SEDIMENTATION RATE: Sed Rate: 1 mm/hr (ref 0–20)

## 2018-09-07 NOTE — Progress Notes (Signed)
Subjective:  Patient ID: Michelle Lang, female    DOB: 1985-09-10  Age: 33 y.o. MRN: [id number]  CC: Annual Exam, Anemia, and Abdominal Pain  New to me.  To establish a PCP.  HPI [name] presents for a CPX.  She complains of a several month history of discomfort in her right lower quadrant that she describes as an intermittent pressure.  She has a history of ovarian cysts and was anemic about a year ago during a pregnancy.  She tells me that her periods last 4 to 5 days and there is some heavy bleeding.  She reports mild loss of appetite and weight loss.  She denies nausea, vomiting, dysuria, hematuria, melena, bright red blood, diarrhea, or constipation.  History Wendy has a past medical history of Depression and Inflammatory arthritis (2014).   She has a past surgical history that includes Appendectomy (2003); Wisdom tooth extraction; and Cesarean section (N/A, 04/20/2017).   Her family history includes Hypertension in her paternal grandfather and paternal grandmother; Kidney cancer in her paternal grandmother; Pernicious anemia in her father; Seizures in her brother.She reports that she has never smoked. She has never used smokeless tobacco. She reports current alcohol use of about 14.0 standard drinks of alcohol per week. She reports that she does not use drugs.  Outpatient Medications Prior to Visit  Medication Sig Dispense Refill  . Adalimumab (HUMIRA PEN) 40 MG/0.4ML PNKT Inject 0.4 mLs into the skin every 14 (fourteen) days. 40 mg every 2 weeks subcutaneous 28 days 2 each 4  . loratadine (CLARITIN) 10 MG tablet Take 10 mg by mouth daily as needed for allergies.    Marland Kitchen ibuprofen (ADVIL,MOTRIN) 600 MG tablet Take 1 tablet (600 mg total) by mouth every 6 (six) hours. (Patient not taking: Reported on 03/24/2018) 30 tablet 0  . magnesium oxide (MAG-OX) 400 MG tablet Take 400 mg by mouth daily.    . Prenatal Vit-Fe Fumarate-FA (MULTIVITAMIN-PRENATAL) 27-0.8 MG TABS tablet Take 1 tablet by  mouth daily.     No facility-administered medications prior to visit.     ROS Review of Systems  Constitutional: Positive for appetite change and unexpected weight change. Negative for chills, diaphoresis, fatigue and fever.  HENT: Negative.   Respiratory: Negative.  Negative for cough.   Cardiovascular: Negative for chest pain and leg swelling.  Gastrointestinal: Negative for abdominal distention, anal bleeding, blood in stool, constipation, diarrhea, nausea and vomiting.  Genitourinary: Positive for menstrual problem. Negative for decreased urine volume, difficulty urinating, dysuria, flank pain, hematuria, pelvic pain, vaginal bleeding, vaginal discharge and vaginal pain.  Musculoskeletal: Positive for arthralgias. Negative for back pain and myalgias.  Skin: Negative.  Negative for color change and rash.  Neurological: Negative.  Negative for dizziness and light-headedness.  Hematological: Negative for adenopathy. Does not bruise/bleed easily.  Psychiatric/Behavioral: Negative.     Objective:  BP 120/80 (BP Location: Left Arm, Patient Position: Sitting, Cuff Size: Normal)   Pulse 64   Temp 99.4 F (37.4 C) (Oral)   Ht 5\' 4"  (1.626 m)   Wt 125 lb (56.7 kg)   LMP 08/22/2018 (Exact Date)   SpO2 99%   BMI 21.46 kg/m   Physical Exam Vitals signs reviewed.  Constitutional:      General: She is not in acute distress.    Appearance: She is not ill-appearing, toxic-appearing or diaphoretic.  HENT:     Mouth/Throat:     Mouth: Mucous membranes are moist.  Eyes:     General: No scleral icterus.  Cardiovascular:     Rate and Rhythm: Normal rate and regular rhythm.     Heart sounds: No murmur.  Pulmonary:     Effort: Pulmonary effort is normal.     Breath sounds: No stridor. No wheezing, rhonchi or rales.  Abdominal:     General: Abdomen is flat. There is no distension.     Palpations: There is no hepatomegaly, splenomegaly or mass.     Tenderness: There is abdominal  tenderness in the right lower quadrant. There is no right CVA tenderness, left CVA tenderness, guarding or rebound.     Hernia: No hernia is present.  Musculoskeletal: Normal range of motion.     Right lower leg: No edema.     Left lower leg: No edema.  Skin:    General: Skin is warm and dry.     Coloration: Skin is not pale.     Findings: No rash.  Neurological:     General: No focal deficit present.     Mental Status: She is alert.  Psychiatric:        Mood and Affect: Mood normal.        Behavior: Behavior normal.      Lab Results  Component Value Date   WBC 9.4 09/07/2018   HGB 13.6 09/07/2018   HCT 41.0 09/07/2018   PLT 291.0 09/07/2018   GLUCOSE 90 09/07/2018   CHOL 141 09/07/2018   TRIG 75.0 09/07/2018   HDL 53.40 09/07/2018   LDLCALC 73 09/07/2018   ALT 10 09/07/2018   AST 15 09/07/2018   NA 137 09/07/2018   K 4.3 09/07/2018   CL 103 09/07/2018   CREATININE 0.76 09/07/2018   BUN 16 09/07/2018   CO2 27 09/07/2018   TSH 2.06 09/07/2018    Assessment & Plan:   Clois was seen today for annual exam, anemia and abdominal pain.  Diagnoses and all orders for this visit:  Routine general medical examination at a health care facility- Exam completed, labs reviewed, vaccines reviewed, Pap is up-to-date. -     Lipid panel; Future  Deficiency anemia- Her H&H are normal now and there are no vitamin deficiencies. -     CBC with Differential/Platelet; Future -     IBC panel; Future -     Vitamin B12; Future -     Folate; Future -     Ferritin; Future  Right lower quadrant abdominal tenderness without rebound tenderness- Her exam is remarkable for tenderness but there is no evidence of an acute abdominal process.  Her labs are reassuring.  I have asked her to undergo a CT with contrast to see if there is an abscess, mass, volvulus, smoldering appendicitis, ovarian cyst or tumor, renal cancer, or bowel obstruction. -     Basic metabolic panel; Future -     Urinalysis,  Routine w reflex microscopic; Future -     hCG, quantitative, pregnancy; Future -     Hepatic function panel; Future -     Sedimentation rate; Future -     CT Abdomen Pelvis W Contrast; Future  Low grade fever- She does not complain of a fever but her temperature is 99.4.  Her labs are negative for any evidence of systemic illness.  She has intermittent right lower quadrant pain so I have asked her to undergo CT with contrast to screen for occult abscess, infection, diverticulitis. -     TSH; Future -     CT Abdomen Pelvis W Contrast; Future  Generalized abdominal pain   I am having Griselle Rufer maintain her multivitamin-prenatal, loratadine, magnesium oxide, ibuprofen, and Adalimumab.  No orders of the defined types were placed in this encounter.    Follow-up: Return in about 4 weeks (around 10/05/2018).  Scarlette Calico, MD

## 2018-09-07 NOTE — Patient Instructions (Signed)
Abdominal Pain, Adult Abdominal pain can be caused by many things. Often, abdominal pain is not serious and it gets better with no treatment or by being treated at home. However, sometimes abdominal pain is serious. Your health care provider will do a medical history and a physical exam to try to determine the cause of your abdominal pain. Follow these instructions at home:  Take over-the-counter and prescription medicines only as told by your health care provider. Do not take a laxative unless told by your health care provider.  Drink enough fluid to keep your urine clear or pale yellow.  Watch your condition for any changes.  Keep all follow-up visits as told by your health care provider. This is important. Contact a health care provider if:  Your abdominal pain changes or gets worse.  You are not hungry or you lose weight without trying.  You are constipated or have diarrhea for more than 2-3 days.  You have pain when you urinate or have a bowel movement.  Your abdominal pain wakes you up at night.  Your pain gets worse with meals, after eating, or with certain foods.  You are throwing up and cannot keep anything down.  You have a fever. Get help right away if:  Your pain does not go away as soon as your health care provider told you to expect.  You cannot stop throwing up.  Your pain is only in areas of the abdomen, such as the right side or the left lower portion of the abdomen.  You have bloody or black stools, or stools that look like tar.  You have severe pain, cramping, or bloating in your abdomen.  You have signs of dehydration, such as: ? Dark urine, very little urine, or no urine. ? Cracked lips. ? Dry mouth. ? Sunken eyes. ? Sleepiness. ? Weakness. This information is not intended to replace advice given to you by your health care provider. Make sure you discuss any questions you have with your health care provider. Document Released: 11/25/2004 Document  Revised: 09/05/2015 Document Reviewed: 07/30/2015 Elsevier Interactive Patient Education  2020 Elsevier Inc.  

## 2018-09-08 ENCOUNTER — Encounter: Payer: Self-pay | Admitting: Internal Medicine

## 2018-09-27 MED FILL — HUMIRA PEN 40 MG/0.4ML PNKT: 40 | 28 days supply | Qty: 2 | Fill #0

## 2018-09-28 ENCOUNTER — Encounter: Payer: Self-pay | Admitting: Radiology

## 2018-09-28 ENCOUNTER — Ambulatory Visit
Admission: RE | Admit: 2018-09-28 | Discharge: 2018-09-28 | Disposition: A | Discharge status: Home / Self Care | Payer: No Typology Code available for payment source | Source: Ambulatory Visit | Attending: Internal Medicine | Admitting: Internal Medicine

## 2018-09-28 ENCOUNTER — Encounter: Payer: Self-pay | Admitting: Internal Medicine

## 2018-09-28 DIAGNOSIS — R10813 Right lower quadrant abdominal tenderness: Secondary | ICD-10-CM

## 2018-09-28 DIAGNOSIS — R509 Fever, unspecified: Secondary | ICD-10-CM

## 2018-09-28 MED ORDER — IOPAMIDOL (ISOVUE-300) INJECTION 61%
100.0000 mL | Freq: Once | INTRAVENOUS | Status: AC | PRN
  Administered 2018-09-28: 100 mL via INTRAVENOUS

## 2018-11-02 ENCOUNTER — Other Ambulatory Visit: Payer: Self-pay | Admitting: Pharmacist

## 2018-11-02 MED ORDER — HUMIRA (2 PEN) 40 MG/0.4ML ~~LOC~~ AJKT: 0.4000 mL | AUTO-INJECTOR | SUBCUTANEOUS | 3 refills | Status: DC

## 2018-11-02 MED FILL — HUMIRA PEN 40 MG/0.4ML PNKT: 40 | 28 days supply | Qty: 2 | Fill #0

## 2018-11-03 ENCOUNTER — Telehealth: Payer: Self-pay | Admitting: Internal Medicine

## 2018-11-03 DIAGNOSIS — L709 Acne, unspecified: Secondary | ICD-10-CM

## 2018-11-03 DIAGNOSIS — M1990 Unspecified osteoarthritis, unspecified site: Secondary | ICD-10-CM

## 2018-11-03 NOTE — Telephone Encounter (Signed)
Patient needs two referrals due to having the focus plan. Needs referral to: [facility] Rheumatology Dr. Amil Amen * inflammatory arthritis   And  [facility] Dermat[facility] Dr. Ubaldo Glassing For annual check ups and acne

## 2018-11-03 NOTE — Telephone Encounter (Signed)
Copied from [place] [id number]. Topic: General - Other >> Nov 03, 2018 10:58 AM Keene Breath wrote: Reason for CRM: Patient called to speak with the nurse regarding some referrals that she needs.  Please advise and call patient back at [phone]

## 2018-11-03 NOTE — Telephone Encounter (Signed)
Left pt vm to call back to either schedule an appt with Dr. Ronnald Ramp or to inform us that she has recently seen Dr. Ronnald Ramp for the reasons she is needing the referrals.

## 2018-11-07 NOTE — Telephone Encounter (Signed)
Referrals entered as requested

## 2018-11-29 MED FILL — HUMIRA PEN 40 MG/0.4ML PNKT: 40 | 28 days supply | Qty: 2 | Fill #1

## 2019-01-10 MED FILL — HUMIRA PEN 40 MG/0.4ML PNKT: 40 | 28 days supply | Qty: 2 | Fill #0

## 2019-02-13 MED FILL — HUMIRA PEN 40 MG/0.4ML PNKT: 40 | 28 days supply | Qty: 2 | Fill #1

## 2019-02-26 ENCOUNTER — Other Ambulatory Visit: Payer: No Typology Code available for payment source

## 2019-03-21 ENCOUNTER — Other Ambulatory Visit: Payer: Self-pay | Admitting: Pharmacist

## 2019-03-21 MED ORDER — HUMIRA (2 PEN) 40 MG/0.4ML ~~LOC~~ AJKT: 0.4000 mL | AUTO-INJECTOR | SUBCUTANEOUS | 3 refills | Status: DC

## 2019-03-21 MED FILL — HUMIRA PEN 40 MG/0.4ML PNKT: 40 | 28 days supply | Qty: 2 | Fill #0

## 2019-04-12 MED FILL — HUMIRA PEN 40 MG/0.4ML PNKT: 40 | 28 days supply | Qty: 2 | Fill #1

## 2019-05-11 MED FILL — HUMIRA PEN 40 MG/0.4ML PNKT: 40 | 28 days supply | Qty: 2 | Fill #2

## 2019-05-17 DIAGNOSIS — F4320 Adjustment disorder, unspecified: Secondary | ICD-10-CM | POA: Diagnosis not present

## 2019-05-29 DIAGNOSIS — H5213 Myopia, bilateral: Secondary | ICD-10-CM | POA: Diagnosis not present

## 2019-05-29 DIAGNOSIS — H52203 Unspecified astigmatism, bilateral: Secondary | ICD-10-CM | POA: Diagnosis not present

## 2019-05-31 DIAGNOSIS — F4320 Adjustment disorder, unspecified: Secondary | ICD-10-CM | POA: Diagnosis not present

## 2019-06-05 DIAGNOSIS — F4320 Adjustment disorder, unspecified: Secondary | ICD-10-CM | POA: Diagnosis not present

## 2019-06-11 MED FILL — HUMIRA PEN 40 MG/0.4ML PNKT: 40 | 28 days supply | Qty: 2 | Fill #3

## 2019-06-19 DIAGNOSIS — F4320 Adjustment disorder, unspecified: Secondary | ICD-10-CM | POA: Diagnosis not present

## 2019-06-20 ENCOUNTER — Telehealth: Payer: Self-pay | Admitting: Pharmacist

## 2019-06-20 NOTE — Telephone Encounter (Signed)
Called patient to schedule an appointment for the [facility]. I was unable to reach the patient so I left a HIPAA-compliant message requesting that the patient return my call.   

## 2019-07-03 DIAGNOSIS — F4320 Adjustment disorder, unspecified: Secondary | ICD-10-CM | POA: Diagnosis not present

## 2019-07-06 ENCOUNTER — Other Ambulatory Visit: Payer: Self-pay | Admitting: Internal Medicine

## 2019-07-06 ENCOUNTER — Ambulatory Visit (HOSPITAL_BASED_OUTPATIENT_CLINIC_OR_DEPARTMENT_OTHER): Payer: No Typology Code available for payment source | Admitting: Pharmacist

## 2019-07-06 ENCOUNTER — Other Ambulatory Visit: Payer: Self-pay

## 2019-07-06 DIAGNOSIS — Z79899 Other long term (current) drug therapy: Secondary | ICD-10-CM

## 2019-07-06 MED ORDER — HUMIRA (2 PEN) 40 MG/0.4ML ~~LOC~~ AJKT: 0.4000 mL | AUTO-INJECTOR | SUBCUTANEOUS | 2 refills | Status: DC

## 2019-07-06 NOTE — Progress Notes (Signed)
   S: Patient presents today for review of her specialty medication.  Patient is currently on Humira for rheumatoid arthritis. Patient is managed by Leigh Aurora for this.   Efficacy: continues to work well for her.   Dosing:  Rheumatoid arthritis: SubQ: 40 mg every other week (may continue methotrexate, other nonbiologic DMARDS, corticosteroids, NSAIDs, and/or analgesics); patients not taking concomitant methotrexate may increase dose to 40 mg every week  Drug-drug interactions: none  Screening: TB test: completed per patient Hepatitis: completed per patient  Monitoring: S/sx of infection: none CBC: regularly monitored by Dr. Melissa Noon office Other side effects: none  O:   Lab Results  Component Value Date   WBC 9.4 09/07/2018   HGB 13.6 09/07/2018   HCT 41.0 09/07/2018   MCV 91.0 09/07/2018   PLT 291.0 09/07/2018      Chemistry      Component Value Date/Time   NA 137 09/07/2018 1341   K 4.3 09/07/2018 1341   CL 103 09/07/2018 1341   CO2 27 09/07/2018 1341   BUN 16 09/07/2018 1341   CREATININE 0.76 09/07/2018 1341      Component Value Date/Time   CALCIUM 9.2 09/07/2018 1341   ALKPHOS 61 09/07/2018 1341   AST 15 09/07/2018 1341   ALT 10 09/07/2018 1341   BILITOT 0.6 09/07/2018 1341     A/P: 1. Medication review: Patient currently taking Humira for rheumatoid arthritis with no adverse effects. Patient has been on Humira previously and has no questions, aware of increased risk of infection and possible increased risk of malignancy and is closely follow by Dr. Amil Amen. No recommendations for any changes.   Benard Halsted, PharmD, [place] [phone]

## 2019-07-10 DIAGNOSIS — M0609 Rheumatoid arthritis without rheumatoid factor, multiple sites: Secondary | ICD-10-CM | POA: Diagnosis not present

## 2019-07-10 DIAGNOSIS — Z6823 Body mass index (BMI) 23.0-23.9, adult: Secondary | ICD-10-CM | POA: Diagnosis not present

## 2019-07-16 MED FILL — HUMIRA PEN 40 MG/0.4ML PNKT: 40 | 28 days supply | Qty: 2 | Fill #0

## 2019-07-17 DIAGNOSIS — F4320 Adjustment disorder, unspecified: Secondary | ICD-10-CM | POA: Diagnosis not present

## 2019-07-23 MED FILL — CLOMIPHENE CITRATE 50 MG TA: 50 | 5 days supply | Qty: 5 | Fill #0

## 2019-08-21 ENCOUNTER — Other Ambulatory Visit: Payer: Self-pay

## 2019-08-21 ENCOUNTER — Ambulatory Visit (INDEPENDENT_AMBULATORY_CARE_PROVIDER_SITE_OTHER): Payer: 59 | Admitting: *Deleted

## 2019-08-21 DIAGNOSIS — Z3201 Encounter for pregnancy test, result positive: Secondary | ICD-10-CM

## 2019-08-21 DIAGNOSIS — Z3200 Encounter for pregnancy test, result unknown: Secondary | ICD-10-CM

## 2019-08-21 LAB — POCT URINE PREGNANCY: Preg Test, Ur: POSITIVE — AB

## 2019-08-21 NOTE — Progress Notes (Signed)
Michelle Lang presents today for UPT. She has no unusual complaints. LMP:07/02/2019, 7.[redacted] weeks gestation today EDD: 04/08/2019   OBJECTIVE: Appears well, in no apparent distress.  OB History    Gravida  1   Para  1   Term  1   Preterm  0   AB  0   Living  1     SAB  0   TAB  0   Ectopic  0   Multiple  0   Live Births  1          Home UPT Result:Positive In-Office UPT result:Positive   ASSESSMENT: Positive pregnancy test  PLAN Prenatal care to be completed at: unsure at this time

## 2019-08-22 NOTE — Progress Notes (Signed)
Patient was assessed and managed by nursing staff during this encounter. I have reviewed the chart and agree with the documentation and plan. I have also made any necessary editorial changes.  Griffin Basil, MD 08/22/2019 9:09 PM

## 2019-08-23 DIAGNOSIS — Z3689 Encounter for other specified antenatal screening: Secondary | ICD-10-CM | POA: Diagnosis not present

## 2019-08-23 DIAGNOSIS — Z3200 Encounter for pregnancy test, result unknown: Secondary | ICD-10-CM | POA: Diagnosis not present

## 2019-09-04 DIAGNOSIS — Z3201 Encounter for pregnancy test, result positive: Secondary | ICD-10-CM | POA: Diagnosis not present

## 2019-09-17 DIAGNOSIS — O368120 Decreased fetal movements, second trimester, not applicable or unspecified: Secondary | ICD-10-CM | POA: Diagnosis not present

## 2019-09-17 DIAGNOSIS — Z3A27 27 weeks gestation of pregnancy: Secondary | ICD-10-CM | POA: Diagnosis not present

## 2019-09-17 DIAGNOSIS — Z118 Encounter for screening for other infectious and parasitic diseases: Secondary | ICD-10-CM | POA: Diagnosis not present

## 2019-09-17 DIAGNOSIS — Z3689 Encounter for other specified antenatal screening: Secondary | ICD-10-CM | POA: Diagnosis not present

## 2019-09-17 DIAGNOSIS — Z3480 Encounter for supervision of other normal pregnancy, unspecified trimester: Secondary | ICD-10-CM | POA: Diagnosis not present

## 2019-09-17 DIAGNOSIS — Z3481 Encounter for supervision of other normal pregnancy, first trimester: Secondary | ICD-10-CM | POA: Diagnosis not present

## 2019-09-17 LAB — OB RESULTS CONSOLE RUBELLA ANTIBODY, IGM: Rubella: IMMUNE

## 2019-09-17 LAB — OB RESULTS CONSOLE GC/CHLAMYDIA
Chlamydia: NEGATIVE
Gonorrhea: NEGATIVE

## 2019-09-17 LAB — OB RESULTS CONSOLE HEPATITIS B SURFACE ANTIGEN: Hepatitis B Surface Ag: NEGATIVE

## 2019-09-17 LAB — OB RESULTS CONSOLE RPR: RPR: NONREACTIVE

## 2019-09-17 LAB — OB RESULTS CONSOLE HIV ANTIBODY (ROUTINE TESTING): HIV: NONREACTIVE

## 2019-09-18 ENCOUNTER — Encounter: Payer: 59 | Admitting: Family Medicine

## 2019-10-02 DIAGNOSIS — Z3682 Encounter for antenatal screening for nuchal translucency: Secondary | ICD-10-CM | POA: Diagnosis not present

## 2019-10-30 DIAGNOSIS — Z361 Encounter for antenatal screening for raised alphafetoprotein level: Secondary | ICD-10-CM | POA: Diagnosis not present

## 2019-11-13 DIAGNOSIS — O358XX0 Maternal care for other (suspected) fetal abnormality and damage, not applicable or unspecified: Secondary | ICD-10-CM | POA: Diagnosis not present

## 2019-11-13 DIAGNOSIS — Z3A19 19 weeks gestation of pregnancy: Secondary | ICD-10-CM | POA: Diagnosis not present

## 2020-01-10 DIAGNOSIS — M0609 Rheumatoid arthritis without rheumatoid factor, multiple sites: Secondary | ICD-10-CM | POA: Diagnosis not present

## 2020-01-10 DIAGNOSIS — Z6828 Body mass index (BMI) 28.0-28.9, adult: Secondary | ICD-10-CM | POA: Diagnosis not present

## 2020-01-10 DIAGNOSIS — E663 Overweight: Secondary | ICD-10-CM | POA: Diagnosis not present

## 2020-01-10 DIAGNOSIS — Z23 Encounter for immunization: Secondary | ICD-10-CM | POA: Diagnosis not present

## 2020-01-14 DIAGNOSIS — O368120 Decreased fetal movements, second trimester, not applicable or unspecified: Secondary | ICD-10-CM | POA: Diagnosis not present

## 2020-01-14 DIAGNOSIS — Z3A27 27 weeks gestation of pregnancy: Secondary | ICD-10-CM | POA: Diagnosis not present

## 2020-01-22 DIAGNOSIS — Z3689 Encounter for other specified antenatal screening: Secondary | ICD-10-CM | POA: Diagnosis not present

## 2020-01-22 DIAGNOSIS — Z23 Encounter for immunization: Secondary | ICD-10-CM | POA: Diagnosis not present

## 2020-03-01 NOTE — L&D Delivery Note (Signed)
Delivery Note:   G2P1001 at [redacted]w[redacted]d  Admitting diagnosis: Encounter for induction of labor [Z34.90] Risks: TOLAC Onset of labor: 2/11 @1830  IOL/Augmentation: AROM, Pitocin and IP Foley ROM: 2/11 @ 1955, clear fluid  Complete dilation at 04/12/2020  0341 Onset of pushing at 0424 FHR second stage Cat I  Analgesia /Anesthesia intrapartum:Epidural  Pushing in right side lying and lithotomy position with CNM and L&D staff support, husband, Thurmond Butts, present for birth and supportive.  Delivery of a Live born female  Birth Weight:  4026gm, 8lb 14oz APGAR: 8, 9  Newborn Delivery   Birth date/time: 04/12/2020 05:02:00 Delivery type: VBAC, Spontaneous      in cephalic presentation, position OA to LOA.  APGAR:1 min-8 , 5 min-9   Nuchal Cord: No  Cord double clamped after cessation of pulsation, cut by Thurmond Butts.  Collection of cord blood for typing completed. Cord blood donation-None  Arterial cord blood sample-No    Placenta delivered-Spontaneous  with 3 vessels . Uterotonics: Pitocin Placenta to L&D Uterine tone Firm  Bleeding brisk then resolved to moderate after fundal massage, bleeding from laceration slowed with repair, fundus firm and bleeding minimal after repair  2nd degree and left labial laceration identified.  Episiotomy:None  Local analgesia: N/A  Repair: 2-0 Vicryl in the usual fashion with excellent hemostasis Est. Blood Loss (PZ):025.85   Complications: None  Mom to postpartum.  Baby [name] to Couplet care / Skin to Skin.  Delivery Report:   Review the Delivery Report for details.    Michelle Lang, CNM, MSN 04/12/2020, 5:53 AM

## 2020-03-03 DIAGNOSIS — Z3A35 35 weeks gestation of pregnancy: Secondary | ICD-10-CM | POA: Diagnosis not present

## 2020-03-03 DIAGNOSIS — O09893 Supervision of other high risk pregnancies, third trimester: Secondary | ICD-10-CM | POA: Diagnosis not present

## 2020-03-03 DIAGNOSIS — Z369 Encounter for antenatal screening, unspecified: Secondary | ICD-10-CM | POA: Diagnosis not present

## 2020-03-03 DIAGNOSIS — Z3A40 40 weeks gestation of pregnancy: Secondary | ICD-10-CM | POA: Diagnosis not present

## 2020-03-03 DIAGNOSIS — O321XX9 Maternal care for breech presentation, other fetus: Secondary | ICD-10-CM | POA: Diagnosis not present

## 2020-03-03 DIAGNOSIS — Z3483 Encounter for supervision of other normal pregnancy, third trimester: Secondary | ICD-10-CM | POA: Diagnosis not present

## 2020-03-13 DIAGNOSIS — Z3685 Encounter for antenatal screening for Streptococcus B: Secondary | ICD-10-CM | POA: Diagnosis not present

## 2020-03-13 DIAGNOSIS — Z3483 Encounter for supervision of other normal pregnancy, third trimester: Secondary | ICD-10-CM | POA: Diagnosis not present

## 2020-03-13 DIAGNOSIS — O321XX9 Maternal care for breech presentation, other fetus: Secondary | ICD-10-CM | POA: Diagnosis not present

## 2020-03-13 DIAGNOSIS — Z3A36 36 weeks gestation of pregnancy: Secondary | ICD-10-CM | POA: Diagnosis not present

## 2020-03-14 ENCOUNTER — Other Ambulatory Visit: Payer: Self-pay | Admitting: Obstetrics & Gynecology

## 2020-03-19 LAB — OB RESULTS CONSOLE GBS: GBS: NEGATIVE

## 2020-04-07 ENCOUNTER — Encounter (HOSPITAL_COMMUNITY): Payer: Self-pay

## 2020-04-07 ENCOUNTER — Inpatient Hospital Stay (HOSPITAL_COMMUNITY): Admit: 2020-04-07 | Payer: 59 | Admitting: Obstetrics & Gynecology

## 2020-04-07 SURGERY — Surgical Case
Anesthesia: Spinal

## 2020-04-08 DIAGNOSIS — O09893 Supervision of other high risk pregnancies, third trimester: Secondary | ICD-10-CM | POA: Diagnosis not present

## 2020-04-08 DIAGNOSIS — Z3A40 40 weeks gestation of pregnancy: Secondary | ICD-10-CM | POA: Diagnosis not present

## 2020-04-10 DIAGNOSIS — Z3A40 40 weeks gestation of pregnancy: Secondary | ICD-10-CM | POA: Diagnosis not present

## 2020-04-10 DIAGNOSIS — O09893 Supervision of other high risk pregnancies, third trimester: Secondary | ICD-10-CM | POA: Diagnosis not present

## 2020-04-10 MED FILL — HUMIRA PEN 40 MG/0.4ML PNKT: 40 | 28 days supply | Qty: 2 | Fill #1

## 2020-04-11 ENCOUNTER — Encounter (HOSPITAL_COMMUNITY): Payer: Self-pay | Admitting: Obstetrics & Gynecology

## 2020-04-11 ENCOUNTER — Inpatient Hospital Stay (HOSPITAL_COMMUNITY): Payer: 59 | Admitting: Anesthesiology

## 2020-04-11 ENCOUNTER — Inpatient Hospital Stay (HOSPITAL_COMMUNITY)
Admission: AD | Admit: 2020-04-11 | Discharge: 2020-04-13 | DRG: 807 | Disposition: A | Discharge status: Home / Self Care | Payer: 59 | Attending: Obstetrics and Gynecology | Admitting: Obstetrics and Gynecology

## 2020-04-11 ENCOUNTER — Inpatient Hospital Stay (HOSPITAL_COMMUNITY): Payer: 59

## 2020-04-11 ENCOUNTER — Other Ambulatory Visit: Payer: Self-pay

## 2020-04-11 DIAGNOSIS — O9081 Anemia of the puerperium: Secondary | ICD-10-CM | POA: Diagnosis not present

## 2020-04-11 DIAGNOSIS — Z20822 Contact with and (suspected) exposure to covid-19: Secondary | ICD-10-CM | POA: Diagnosis not present

## 2020-04-11 DIAGNOSIS — O480 Post-term pregnancy: Principal | ICD-10-CM | POA: Diagnosis present

## 2020-04-11 DIAGNOSIS — Z3A40 40 weeks gestation of pregnancy: Secondary | ICD-10-CM | POA: Diagnosis not present

## 2020-04-11 DIAGNOSIS — O34219 Maternal care for unspecified type scar from previous cesarean delivery: Secondary | ICD-10-CM | POA: Diagnosis not present

## 2020-04-11 DIAGNOSIS — O321XX0 Maternal care for breech presentation, not applicable or unspecified: Secondary | ICD-10-CM | POA: Diagnosis present

## 2020-04-11 DIAGNOSIS — O9902 Anemia complicating childbirth: Secondary | ICD-10-CM | POA: Diagnosis not present

## 2020-04-11 DIAGNOSIS — O3663X0 Maternal care for excessive fetal growth, third trimester, not applicable or unspecified: Secondary | ICD-10-CM | POA: Diagnosis present

## 2020-04-11 DIAGNOSIS — M1990 Unspecified osteoarthritis, unspecified site: Secondary | ICD-10-CM | POA: Diagnosis present

## 2020-04-11 DIAGNOSIS — O26893 Other specified pregnancy related conditions, third trimester: Secondary | ICD-10-CM | POA: Diagnosis present

## 2020-04-11 DIAGNOSIS — Z98891 History of uterine scar from previous surgery: Secondary | ICD-10-CM

## 2020-04-11 DIAGNOSIS — O34211 Maternal care for low transverse scar from previous cesarean delivery: Secondary | ICD-10-CM | POA: Diagnosis not present

## 2020-04-11 DIAGNOSIS — Z3490 Encounter for supervision of normal pregnancy, unspecified, unspecified trimester: Secondary | ICD-10-CM | POA: Diagnosis present

## 2020-04-11 DIAGNOSIS — M1380 Other specified arthritis, unspecified site: Secondary | ICD-10-CM | POA: Diagnosis present

## 2020-04-11 LAB — CBC
HCT: 38.3 % (ref 36.0–46.0)
Hemoglobin: 13.1 g/dL (ref 12.0–15.0)
MCH: 31.8 pg (ref 26.0–34.0)
MCHC: 34.2 g/dL (ref 30.0–36.0)
MCV: 93 fL (ref 80.0–100.0)
Platelets: 205 10*3/uL (ref 150–400)
RBC: 4.12 MIL/uL (ref 3.87–5.11)
RDW: 14.4 % (ref 11.5–15.5)
WBC: 10.3 10*3/uL (ref 4.0–10.5)
nRBC: 0 % (ref 0.0–0.2)

## 2020-04-11 LAB — TYPE AND SCREEN
ABO/RH(D): A POS
Antibody Screen: NEGATIVE

## 2020-04-11 LAB — RESP PANEL BY RT-PCR (FLU A&B, COVID) ARPGX2
Influenza A by PCR: NEGATIVE
Influenza B by PCR: NEGATIVE
SARS Coronavirus 2 by RT PCR: NEGATIVE

## 2020-04-11 LAB — RPR: RPR Ser Ql: NONREACTIVE

## 2020-04-11 MED ORDER — LACTATED RINGERS IV SOLN: INTRAVENOUS | Status: DC

## 2020-04-11 MED ORDER — LACTATED RINGERS IV SOLN
500.0000 mL | Freq: Once | INTRAVENOUS | Status: AC
  Administered 2020-04-11: 500 mL via INTRAVENOUS

## 2020-04-11 MED ORDER — LIDOCAINE HCL (PF) 1 % IJ SOLN: 30.0000 mL | INTRAMUSCULAR | Status: DC | PRN

## 2020-04-11 MED ORDER — FENTANYL-BUPIVACAINE-NACL 0.5-0.125-0.9 MG/250ML-% EP SOLN
12.0000 mL/h | EPIDURAL | Status: DC | PRN
  Filled 2020-04-11: qty 250

## 2020-04-11 MED ORDER — OXYTOCIN BOLUS FROM INFUSION
333.0000 mL | Freq: Once | INTRAVENOUS | Status: AC
  Administered 2020-04-12: 333 mL via INTRAVENOUS

## 2020-04-11 MED ORDER — TERBUTALINE SULFATE 1 MG/ML IJ SOLN: 0.2500 mg | Freq: Once | INTRAMUSCULAR | Status: DC | PRN

## 2020-04-11 MED ORDER — LACTATED RINGERS IV SOLN: 500.0000 mL | INTRAVENOUS | Status: DC | PRN

## 2020-04-11 MED ORDER — OXYTOCIN 10 UNIT/ML IJ SOLN: 10.0000 [IU] | Freq: Once | INTRAMUSCULAR | Status: DC

## 2020-04-11 MED ORDER — OXYTOCIN-SODIUM CHLORIDE 30-0.9 UT/500ML-% IV SOLN
INTRAVENOUS | Status: AC
  Administered 2020-04-11: 2 m[IU]/min via INTRAVENOUS
  Filled 2020-04-11: qty 500

## 2020-04-11 MED ORDER — OXYTOCIN-SODIUM CHLORIDE 30-0.9 UT/500ML-% IV SOLN: 1.0000 m[IU]/min | INTRAVENOUS | Status: DC

## 2020-04-11 MED ORDER — FENTANYL CITRATE (PF) 100 MCG/2ML IJ SOLN
50.0000 ug | INTRAMUSCULAR | Status: DC | PRN
  Administered 2020-04-12: 100 ug via INTRAVENOUS
  Filled 2020-04-11: qty 2

## 2020-04-11 MED ORDER — ONDANSETRON HCL 4 MG/2ML IJ SOLN
4.0000 mg | Freq: Four times a day (QID) | INTRAMUSCULAR | Status: DC | PRN
  Administered 2020-04-12: 4 mg via INTRAVENOUS
  Filled 2020-04-11: qty 2

## 2020-04-11 MED ORDER — SODIUM CHLORIDE 0.9 % IV SOLN: 250.0000 mL | INTRAVENOUS | Status: DC | PRN

## 2020-04-11 MED ORDER — SOD CITRATE-CITRIC ACID 500-334 MG/5ML PO SOLN: 30.0000 mL | ORAL | Status: DC | PRN

## 2020-04-11 MED ORDER — ACETAMINOPHEN 500 MG PO TABS
1000.0000 mg | ORAL_TABLET | Freq: Four times a day (QID) | ORAL | Status: DC | PRN
  Administered 2020-04-11: 1000 mg via ORAL
  Filled 2020-04-11: qty 2

## 2020-04-11 MED ORDER — SODIUM CHLORIDE 0.9% FLUSH: 3.0000 mL | INTRAVENOUS | Status: DC | PRN

## 2020-04-11 MED ORDER — EPHEDRINE 5 MG/ML INJ: 10.0000 mg | INTRAVENOUS | Status: DC | PRN

## 2020-04-11 MED ORDER — SODIUM CHLORIDE 0.9% FLUSH: 3.0000 mL | Freq: Two times a day (BID) | INTRAVENOUS | Status: DC

## 2020-04-11 MED ORDER — PHENYLEPHRINE 40 MCG/ML (10ML) SYRINGE FOR IV PUSH (FOR BLOOD PRESSURE SUPPORT)
80.0000 ug | PREFILLED_SYRINGE | INTRAVENOUS | Status: DC | PRN
  Filled 2020-04-11: qty 10

## 2020-04-11 MED ORDER — SODIUM BICARBONATE 8.4 % IV SOLN
INTRAVENOUS | Status: DC | PRN
  Administered 2020-04-11: 4 mL via EPIDURAL

## 2020-04-11 MED ORDER — OXYTOCIN-SODIUM CHLORIDE 30-0.9 UT/500ML-% IV SOLN
2.5000 [IU]/h | INTRAVENOUS | Status: DC
  Filled 2020-04-11: qty 500

## 2020-04-11 MED ORDER — DIPHENHYDRAMINE HCL 50 MG/ML IJ SOLN: 12.5000 mg | INTRAMUSCULAR | Status: DC | PRN

## 2020-04-11 MED ORDER — PHENYLEPHRINE 40 MCG/ML (10ML) SYRINGE FOR IV PUSH (FOR BLOOD PRESSURE SUPPORT)
80.0000 ug | PREFILLED_SYRINGE | INTRAVENOUS | Status: DC | PRN
  Administered 2020-04-12 (×2): 80 ug via INTRAVENOUS

## 2020-04-11 MED ORDER — LIDOCAINE-EPINEPHRINE (PF) 2 %-1:200000 IJ SOLN
INTRAMUSCULAR | Status: DC | PRN
  Administered 2020-04-11: 12 mL via EPIDURAL

## 2020-04-11 NOTE — Anesthesia Procedure Notes (Signed)
Epidural Patient location during procedure: OB Start time: 04/11/2020 9:33 PM End time: 04/11/2020 9:45 PM  Staffing Anesthesiologist: Barnet Glasgow, MD Performed: anesthesiologist   Preanesthetic Checklist Completed: patient identified, IV checked, site marked, risks and benefits discussed, surgical consent, monitors and equipment checked, pre-op evaluation and timeout performed  Epidural Patient position: sitting Prep: DuraPrep and site prepped and draped Patient monitoring: continuous pulse ox and blood pressure Approach: midline Location: L4-L5 Injection technique: LOR air  Needle:  Needle type: Tuohy  Needle gauge: 17 G Needle length: 9 cm and 9 Needle insertion depth: 5 cm Catheter type: closed end flexible Catheter size: 19 Gauge Catheter at skin depth: 11 cm Test dose: negative  Assessment Events: blood not aspirated, injection not painful, no injection resistance, no paresthesia and negative IV test  Additional Notes Patient identified. Risks/Benefits/Options discussed with patient including but not limited to bleeding, infection, nerve damage, paralysis, failed block, incomplete pain control, headache, blood pressure changes, nausea, vomiting, reactions to medication both or allergic, itching and postpartum back pain. Confirmed with bedside nurse the patient's most recent platelet count. Confirmed with patient that they are not currently taking any anticoagulation, have any bleeding history or any family history of bleeding disorders. Patient expressed understanding and wished to proceed. All questions were answered. Sterile technique was used throughout the entire procedure. Please see nursing notes for vital signs. Test dose was given through epidural needle and negative prior to continuing to dose epidural or start infusion. Warning signs of high block given to the patient including shortness of breath, tingling/numbness in hands, complete motor block, or any  concerning symptoms with instructions to call for help. Patient was given instructions on fall risk and not to get out of bed. All questions and concerns addressed with instructions to call with any issues.  2 Attempt (S) . 1st attempt @L3 -4 w loss of resistance 4 + CSF, 2nd attempt at L4-L5     Patient tolerated procedure well.

## 2020-04-11 NOTE — Progress Notes (Signed)
S: Comfortable with epidural. Husband at the bedside. Discussed the R/B/A of IUPC placement for contraction monitoring and patient consents to procedure.   O: Vitals:   04/11/20 2200 04/11/20 2205 04/11/20 2210 04/11/20 2230  BP: 124/67 112/67 105/60 104/66  Pulse: 88 94 (!) 111 91  Resp:      Temp:      TempSrc:      Weight:      Height:       FHT:  FHR: 135 bpm, variability: moderate,  accelerations:  Present,  decelerations:  Absent UC:   regular, every 2-3 minutes SVE:   Dilation: 4 Effacement (%): 70 Station: -2 Exam by: Gavin Potters  IUPC placed without difficulty. Patient tolerated procedure well.   A / P: Induction of labor due to TOLAC, s/p Foley balloon for cervical ripening, Pitocin infusing at 49mu  Fetal Wellbeing:  Category I Pain Control:  Epidural Anticipated MOD:  NSVD   Will continue to increase Pitocin until adequate MVUs. Will reassess SVE in 4 hours.   Suzan Nailer, CNM, MSN 04/11/2020, 10:51 PM

## 2020-04-11 NOTE — Progress Notes (Signed)
S: Reports mild cramping. Husband, Thurmond Butts, at the bedside. Discussed the R/B/A of Foley balloon insertion for cervical ripening and patient consents to procedure.   O: Vitals:   04/11/20 1115 04/11/20 1149 04/11/20 1330 04/11/20 1337  BP: (!) 111/55 (!) 108/49 108/63   Pulse: 82 86 83   Resp: 18 18 16    Temp:  (!) 97.3 F (36.3 C)  98.5 F (36.9 C)  TempSrc:  Axillary  Oral  Weight:      Height:       FHT:  FHR: 135 bpm, variability: moderate,  accelerations:  Present,  decelerations:  Absent UC:   irregular, every 3-5 minutes SVE:   Dilation: 1 Effacement (%): 50 Exam by:: Gavin Potters CNM   Foley balloon placed on third attempt. 60ccs of fluid instilled in balloon and taped to leg for traction. Patient tolerated procedure well.   A / P: Induction of labor due to TOLAC,  progressing well on pitocin  Fetal Wellbeing:  Category I Pain Control:  Labor support without medications Anticipated MOD:  NSVD   Will continue to increase Pitocin 2x2 until adequate contraction pattern is achieved. Will consider AROM when Foley balloon is expelled.   Suzan Nailer, CNM, MSN 04/11/2020, 1:58 PM

## 2020-04-11 NOTE — Progress Notes (Signed)
S: Reports feeling moderate cramping but improved since foley balloon was expelled. Husband, Thurmond Butts, at the bedside. Discussed the R/B/A of AROM for labor induction and patient consents to procedure.   O: Vitals:   04/11/20 1738 04/11/20 1810 04/11/20 1838 04/11/20 1902  BP: 111/79 116/74 109/61 (!) 108/56  Pulse: 72 67 77 80  Resp: 18 16 16 18   Temp:      TempSrc:      Weight:      Height:       FHT:  FHR: 145 bpm, variability: moderate,  accelerations:  Present,  decelerations:  Absent UC:   regular, every 2-4 minutes SVE:   Dilation: 4.5 Effacement (%): 60 Station: -2 Exam by:: Gavin Potters   AROM of clear fluid at 1955  A / P: Spontaneous labor, progressing normally, s/p Foley balloon for cervical ripening, Pitocin infusing at 73mu  Fetal Wellbeing:  Category I Pain Control:  Labor support without medications Anticipated MOD:  NSVD   Patient may have epidural upon request. Will place IUPC after epidural to assess labor progress and increase Pitocin until adequate MVUs.   Suzan Nailer, CNM, MSN 04/11/2020, 8:03 PM

## 2020-04-11 NOTE — Anesthesia Preprocedure Evaluation (Signed)
Anesthesia Evaluation  Patient identified by MRN, date of birth, ID band Patient awake    Reviewed: Allergy & Precautions, NPO status , Patient's Chart, lab work & pertinent test results  Airway Mallampati: II  TM Distance: >3 FB Neck ROM: Full    Dental no notable dental hx. (+) Teeth Intact, Dental Advisory Given   Pulmonary neg pulmonary ROS,    Pulmonary exam normal breath sounds clear to auscultation       Cardiovascular Exercise Tolerance: Good Normal cardiovascular exam Rhythm:Regular Rate:Normal     Neuro/Psych PSYCHIATRIC DISORDERS negative neurological ROS     GI/Hepatic negative GI ROS, Neg liver ROS,   Endo/Other  negative endocrine ROS  Renal/GU negative Renal ROS     Musculoskeletal   Abdominal   Peds  Hematology Lab Results      Component                Value               Date                      WBC                      10.3                04/11/2020                HGB                      13.1                04/11/2020                HCT                      38.3                04/11/2020                MCV                      93.0                04/11/2020                PLT                      205                 04/11/2020             Anesthesia Other Findings   Reproductive/Obstetrics (+) Pregnancy                             Anesthesia Physical Anesthesia Plan  ASA: II  Anesthesia Plan: Epidural   Post-op Pain Management:    Induction:   PONV Risk Score and Plan:   Airway Management Planned:   Additional Equipment:   Intra-op Plan:   Post-operative Plan:   Informed Consent: I have reviewed the patients History and Physical, chart, labs and discussed the procedure including the risks, benefits and alternatives for the proposed anesthesia with the patient or authorized representative who has indicated his/her understanding and acceptance.        Plan Discussed with:   Anesthesia Plan Comments: (  40.4 wk G2P1 for TOLAC w ?fetal macrosomia for LEA)        Anesthesia Quick Evaluation

## 2020-04-11 NOTE — Progress Notes (Signed)
Met with pt to review risks.  Post dates, LGA, AC 5cm >HC. Prior LTCS for previa. vtx but unengaged. Reactive NST. Pit/ foley bulb in place. Not planning additional pregnancies. Wants TOLAC for decreased pain and 'more natural.' d/w pt often times can justify risks of TOLAC for benefit of avoiding c/s in future pregnancies but that logic does not apply here. D/w pt risks of uterine rupture, about 1/200 and in those cases 10-15% risk fetal hypoxia and damage, also increased risks of bleeding/ infection, surgical trauma due to speed at which c/s needs to be performed. Given LGA and unengaged head d/w pt risks of shoulder dystocia with brachial plexus injury and risks of fetal hypoxia. Tried to reassure pt that this risk is difficult to quantify and shoulder dystocia with significant impaction is rare.Finally d/w pt surgical risks (bleeding, infection, surrounding damage) lowest with successful TOLAC, greatest with failed TOLAC needing RCS and middle level risk is RCS w/o labor. Pt and husband understand risk and want to continue with TOLAC attempt.   Ala Dach 04/11/2020 3:54 PM

## 2020-04-11 NOTE — H&P (Signed)
OB ADMISSION/ HISTORY & PHYSICAL:  Admission Date: 04/11/2020  7:58 AM  Admit Diagnosis: Encounter for induction of labor [Z34.90]    Michelle Lang is a 35 y.o. female at [redacted]w[redacted]d presenting for IOL. Patient desires TOLAC. G1 was a PLTCS for placenta previa. Pregnancy has been uncomplicated. Breech presentation until 34 weeks with spontaneous version to vertex. Reports occasional contractions. Denies leaking of fluid or vaginal bleeding. Endorses + fetal movement. R/B/A of TOLAC discussed with patient and husband, Michelle Lang. Expecting a baby girl, "Shawn Route".  Prenatal History: G2P1001   EDC : 04/07/2020, by Last Menstrual Period  Prenatal care at [facility] since 10 weeks, primary M. Sigmon CNM  Prenatal course complicated by: 1. History of cesarean section with G1 due to placenta previa 2. Inflammatory arthritis, stable off meds  Prenatal Labs: ABO, Rh:   A POS Antibody: NEG (02/11 0856) Rubella: Immune (07/19 0000)  RPR: NON REACTIVE (02/11 0856)  HBsAg: Negative (07/19 0000)  HIV: Non-reactive (07/19 0000)  GBS: Negative/-- (01/19 0000)  1 hr Glucola : 87 Genetic Screening: Low risk Panorama, AFP-1 neg Ultrasound: normal XX anatomy, posterior placenta, Growth on 2/10 - AFI 12cm, EFW 79% with AC 99%, BPP 8/8  TDaP          UTD Flu             UTD COVID-19 UTD    Maternal Diabetes: No Genetic Screening: Normal Maternal Ultrasounds/Referrals: Normal Fetal Ultrasounds or other Referrals:  None Maternal Substance Abuse:  No Significant Maternal Medications:  None Significant Maternal Lab Results:  Group B Strep negative Other Comments:  None  Medical / Surgical History : Past medical history:  Past Medical History:  Diagnosis Date  . Depression    mild seasonal dep  . Inflammatory arthritis 2014    Past surgical history:  Past Surgical History:  Procedure Laterality Date  . APPENDECTOMY  2003  . CESAREAN SECTION N/A 04/20/2017   Procedure: CESAREAN SECTION;  Surgeon: Marylynn Pearson, MD;  Location: [facility];  Service: Obstetrics;  Laterality: N/A;  Primary edc 05/10/17 NKDA  . WISDOM TOOTH EXTRACTION      Family History:  Family History  Problem Relation Age of Onset  . Seizures Brother   . Hypertension Paternal Grandmother   . Kidney cancer Paternal Grandmother   . Hypertension Paternal Grandfather   . Pernicious anemia Father   . Arthritis Neg Hx   . Cancer Neg Hx   . Diabetes Neg Hx   . Heart disease Neg Hx   . Hyperlipidemia Neg Hx   . Mental retardation Neg Hx   . Birth defects Neg Hx     Social History:  reports that she has never smoked. She has never used smokeless tobacco. She reports previous alcohol use of about 14.0 standard drinks of alcohol per week. She reports that she does not use drugs.  Allergies: Other   Current Medications at time of admission:  Medications Prior to Admission  Medication Sig Dispense Refill Last Dose  . ferrous sulfate 325 (65 FE) MG tablet Take 325 mg by mouth daily with breakfast.   04/10/2020 at Unknown time  . magnesium oxide (MAG-OX) 400 MG tablet Take 400 mg by mouth at bedtime.   04/10/2020 at Unknown time  . Prenatal Vit-Fe Fumarate-FA (PRENATAL MULTIVITAMIN) TABS tablet Take 1 tablet by mouth at bedtime.   04/10/2020 at Unknown time    Review of Systems: Review of Systems  All other systems reviewed and are negative.  Physical Exam: Vital signs and nursing notes reviewed.  Patient Vitals for the past 24 hrs:  BP Temp Temp src Pulse Resp Height Weight  04/11/20 1149 (!) 108/49 (!) 97.3 F (36.3 C) Axillary 86 18 - -  04/11/20 1115 (!) 111/55 - - 82 18 - -  04/11/20 1046 119/66 - - 95 18 - -  04/11/20 1015 117/77 - - (!) 106 18 - -  04/11/20 0911 - - - - - 5\' 4"  (1.626 m) 77.6 kg  04/11/20 0824 110/66 98 F (36.7 C) Oral 91 16 - -    General: AAO x 3, NAD Heart: RRR Lungs:CTAB Abdomen: Gravid, NT, Leopold's vertex Extremities: no edema Genitalia / VE: Dilation:  Fingertip Presentation: Vertex Exam by:: A Ajna Moors CNM   FHR: 135BPM, mod variability, + accels, no decels TOCO: Contractions occasional  Labs:   Pending T&S, CBC, RPR  Recent Labs    04/11/20 0856  WBC 10.3  HGB 13.1  HCT 38.3  PLT 205   Assessment:  35 y.o. G2P1001 at [redacted]w[redacted]d, TOLAC  1. Elective IOL at term 2. FHR category 1 3. GBS negative 4. Desires TOLAC with epidural 5. Plans to breastfeed 6. PLTCS with G1 due to placenta previa  Plan:  1. Admit to [facility] 2. Routine L&D orders 3. Analgesia/anesthesia PRN  4. Will start Pitocin 2x2 and plan for Foley balloon at next SVE if favorable 5. Anticipate NSVB  Dr. Murrell Redden notified of admission/plan of care.  Suzan Nailer CNM, MSN 04/11/2020, 1:16 PM

## 2020-04-12 ENCOUNTER — Encounter (HOSPITAL_COMMUNITY): Payer: Self-pay | Admitting: Obstetrics and Gynecology

## 2020-04-12 DIAGNOSIS — O34219 Maternal care for unspecified type scar from previous cesarean delivery: Secondary | ICD-10-CM | POA: Diagnosis not present

## 2020-04-12 MED ORDER — LACTATED RINGERS IV SOLN: INTRAVENOUS | Status: DC

## 2020-04-12 MED ORDER — FENTANYL CITRATE (PF) 100 MCG/2ML IJ SOLN
INTRAMUSCULAR | Status: DC | PRN
  Administered 2020-04-12: 100 ug via EPIDURAL

## 2020-04-12 MED ORDER — DIBUCAINE (PERIANAL) 1 % EX OINT: 1.0000 "application " | TOPICAL_OINTMENT | CUTANEOUS | Status: DC | PRN

## 2020-04-12 MED ORDER — ONDANSETRON HCL 4 MG/2ML IJ SOLN: 4.0000 mg | INTRAMUSCULAR | Status: DC | PRN

## 2020-04-12 MED ORDER — ZOLPIDEM TARTRATE 5 MG PO TABS: 5.0000 mg | ORAL_TABLET | Freq: Every evening | ORAL | Status: DC | PRN

## 2020-04-12 MED ORDER — WITCH HAZEL-GLYCERIN EX PADS: 1.0000 "application " | MEDICATED_PAD | CUTANEOUS | Status: DC | PRN

## 2020-04-12 MED ORDER — ACETAMINOPHEN 325 MG PO TABS
650.0000 mg | ORAL_TABLET | ORAL | Status: DC | PRN
  Administered 2020-04-12 – 2020-04-13 (×2): 650 mg via ORAL
  Filled 2020-04-12 (×2): qty 2

## 2020-04-12 MED ORDER — SENNOSIDES-DOCUSATE SODIUM 8.6-50 MG PO TABS
2.0000 | ORAL_TABLET | Freq: Every day | ORAL | Status: DC
  Administered 2020-04-13: 2 via ORAL
  Filled 2020-04-12: qty 2

## 2020-04-12 MED ORDER — DIPHENHYDRAMINE HCL 25 MG PO CAPS: 25.0000 mg | ORAL_CAPSULE | Freq: Four times a day (QID) | ORAL | Status: DC | PRN

## 2020-04-12 MED ORDER — BENZOCAINE-MENTHOL 20-0.5 % EX AERO
1.0000 "application " | INHALATION_SPRAY | CUTANEOUS | Status: DC | PRN
  Administered 2020-04-12: 1 via TOPICAL
  Filled 2020-04-12: qty 56

## 2020-04-12 MED ORDER — ONDANSETRON HCL 4 MG PO TABS: 4.0000 mg | ORAL_TABLET | ORAL | Status: DC | PRN

## 2020-04-12 MED ORDER — IBUPROFEN 600 MG PO TABS
600.0000 mg | ORAL_TABLET | Freq: Four times a day (QID) | ORAL | Status: DC
  Administered 2020-04-12 – 2020-04-13 (×4): 600 mg via ORAL
  Filled 2020-04-12 (×5): qty 1

## 2020-04-12 MED ORDER — COCONUT OIL OIL: 1.0000 "application " | TOPICAL_OIL | Status: DC | PRN

## 2020-04-12 MED ORDER — BUPIVACAINE HCL (PF) 0.25 % IJ SOLN
INTRAMUSCULAR | Status: DC | PRN
  Administered 2020-04-12: 8 mL via EPIDURAL

## 2020-04-12 MED ORDER — TETANUS-DIPHTH-ACELL PERTUSSIS 5-2.5-18.5 LF-MCG/0.5 IM SUSY: 0.5000 mL | PREFILLED_SYRINGE | Freq: Once | INTRAMUSCULAR | Status: DC

## 2020-04-12 MED ORDER — PRENATAL MULTIVITAMIN CH
1.0000 | ORAL_TABLET | Freq: Every day | ORAL | Status: DC
  Administered 2020-04-12 – 2020-04-13 (×2): 1 via ORAL
  Filled 2020-04-12 (×2): qty 1

## 2020-04-12 MED ORDER — SIMETHICONE 80 MG PO CHEW: 80.0000 mg | CHEWABLE_TABLET | ORAL | Status: DC | PRN

## 2020-04-12 NOTE — Progress Notes (Signed)
[name] was referred for history of depression/anxiety. * Referral screened out by Clinical Social Worker because none of the following criteria appear to apply: ~ History of anxiety/depression during this pregnancy, or of post-partum depression following prior delivery. ~ Diagnosis of anxiety and/or depression within last 3 years. No concerns noted in OB records. OR * [name]'s symptoms currently being treated with medication and/or therapy.  Please contact the Clinical Social Worker if needs arise, by [name] request, or if [name] scores greater than 9/yes to question 10 on Edinburgh Postpartum Depression Screen.   Michelle Lang, MSW, LCSW Clinical Social Work [phone]  

## 2020-04-12 NOTE — Lactation Note (Signed)
This note was copied from a baby's chart. Lactation Consultation Note  Patient Name: Michelle Lang [id number] Date: 04/12/2020 Reason for consult: Initial assessment;Term Age:35 hours  Visited with mom of 24 hours old FT female, she's a P2 and experienced BF. LC came back at 2:57 to help mom with the latch, she was doing STS with baby when entered the room. Offered assistance with latch and mom was eager to get some tips to make sure baby is latching on correctly. She reported BF has been going well and baby has been able to latch so far, last LATCH score was 8.  LC also assisted with hand expression, noted that mom has large everted nipples and she's able to express colostrum very easily, LC rubbed it in baby's mouth but she wasn't interested in feeding or showing any cues; mom said she just fed shortly before [place] came in the room. LC assisted mom with positioning baby in cross cradle position, she wanted to know how baby's arms should lay on her.  Baby woke up and started crying, but she would not latch. She kept showing cues, but she would not latch or even sucked on a gloved finger, documented attempt and asked mom to call for assistance when needed, baby fell asleep shortly after doing STS with mom. Reviewed normal newborn behavior, feeding cues, cluster feeding, size of baby's stomach and lactogenesis II.  Feeding plan:  1. Encouraged mom to continue feeding baby STS 8-12 times/24 hours or sooner if feeding cues are present 2. Hand expression and breast massage were also encouraged prior latching  BF brochure, BF resources and feeding diary were reviewed. FOB present and supportive. Parents reported all questions and concerns were answered, they're both aware of [facility] services and will call PRN.   Maternal Data Has patient been taught Hand Expression?: Yes Does the patient have breastfeeding experience prior to this delivery?: Yes How long did the patient breastfeed?: BF her first baby  for 2 years  Feeding Mother's Current Feeding Choice: Breast Milk  LATCH Score                    Lactation Tools Discussed/Used    Interventions Interventions: Breast feeding basics reviewed;Assisted with latch;Skin to skin;Breast massage;Hand express;Breast compression;Support pillows;Adjust position  Discharge Pump: Personal (DEBP at home) [facility] Program: No  Consult Status Consult Status: Follow-up Date: 04/13/20 Follow-up type: In-patient    Malavika Lira Francene Boyers 04/12/2020, 3:28 PM

## 2020-04-12 NOTE — Lactation Note (Signed)
This note was copied from a baby's chart. Lactation Consultation Note  Patient Name: Michelle Lang [id number] Date: 04/12/2020   Age:35 hours  Attempted to visit with mother of a 76 hour old FT female, but family asked LC to come back later, around 3 pm when baby might be ready to feed again, she was currently sleeping and RN Roselyn Reef reported that baby has been nursing consistently, mom still wanted to see lactation. LC to come back to do initial assessment for baby's next feeding.  Maternal Data    Feeding    LATCH Score                    Lactation Tools Discussed/Used    Interventions    Discharge    Consult Status      Michelle Lang 04/12/2020, 12:27 PM

## 2020-04-12 NOTE — Plan of Care (Signed)
Education and plan of care complete on labor and delivery Wyvonnia Lora RN

## 2020-04-13 DIAGNOSIS — O9902 Anemia complicating childbirth: Secondary | ICD-10-CM | POA: Diagnosis not present

## 2020-04-13 LAB — CBC
HCT: 26.2 % — ABNORMAL LOW (ref 36.0–46.0)
Hemoglobin: 8.8 g/dL — ABNORMAL LOW (ref 12.0–15.0)
MCH: 32.4 pg (ref 26.0–34.0)
MCHC: 33.6 g/dL (ref 30.0–36.0)
MCV: 96.3 fL (ref 80.0–100.0)
Platelets: 152 10*3/uL (ref 150–400)
RBC: 2.72 MIL/uL — ABNORMAL LOW (ref 3.87–5.11)
RDW: 14.7 % (ref 11.5–15.5)
WBC: 15.5 10*3/uL — ABNORMAL HIGH (ref 4.0–10.5)
nRBC: 0 % (ref 0.0–0.2)

## 2020-04-13 MED ORDER — POLYSACCHARIDE IRON COMPLEX 150 MG PO CAPS: 150.0000 mg | ORAL_CAPSULE | Freq: Every day | ORAL | 1 refills | Status: DC

## 2020-04-13 MED ORDER — MAGNESIUM OXIDE 400 (241.3 MG) MG PO TABS: 400.0000 mg | ORAL_TABLET | Freq: Every day | ORAL | 1 refills | Status: DC

## 2020-04-13 MED ORDER — POLYSACCHARIDE IRON COMPLEX 150 MG PO CAPS: 150.0000 mg | ORAL_CAPSULE | Freq: Every day | ORAL | Status: DC

## 2020-04-13 MED ORDER — MAGNESIUM OXIDE 400 (241.3 MG) MG PO TABS: 400.0000 mg | ORAL_TABLET | Freq: Every day | ORAL | Status: DC

## 2020-04-13 MED ORDER — IBUPROFEN 600 MG PO TABS: 600.0000 mg | ORAL_TABLET | Freq: Four times a day (QID) | ORAL | 0 refills | Status: AC

## 2020-04-13 MED ORDER — BENZOCAINE-MENTHOL 20-0.5 % EX AERO: 1.0000 "application " | INHALATION_SPRAY | CUTANEOUS | 1 refills | Status: DC | PRN

## 2020-04-13 MED ORDER — COCONUT OIL OIL: 1.0000 "application " | TOPICAL_OIL | 0 refills | Status: DC | PRN

## 2020-04-13 MED ORDER — ACETAMINOPHEN 325 MG PO TABS: 650.0000 mg | ORAL_TABLET | ORAL | 1 refills | Status: DC | PRN

## 2020-04-13 NOTE — Progress Notes (Signed)
Went to see patient to assess possible PDPH. Patient will all curtains open sitting up at greater than 60 degrees smiling breast feeding. Pt relates in no pain now. Patient relates has had severe neck discomfort after exerting herself and walking around "can be debilitating". But has since subsided.  Pt denies HA currently, Photophobia or nuchal pain.  Pt offered blood patch. Pt declined at this time and opted for further conservative therapy with fluids acetaminophen and NSAIDS. Pt desires to go home today  Will reassess again in 3 hours.  Ishmael Holter. Valma Cava, M.D.

## 2020-04-13 NOTE — Discharge Summary (Addendum)
OB Discharge Summary  Patient Name: Michelle Lang DOB: 1986-02-08 MRN: [id number]  Date of admission: 04/11/2020 Delivering provider: Gavin Potters K   Admitting diagnosis: Encounter for induction of labor [Z34.90] Intrauterine pregnancy: [redacted]w[redacted]d     Secondary diagnosis: Patient Active Problem List   Diagnosis Date Noted  . Maternal anemia, with delivery 04/13/2020  . VBAC (vaginal birth after Cesarean) 2/12 04/12/2020  . Postpartum care following vaginal delivery 2/12 04/12/2020  . Second degree perineal laceration 04/12/2020  . Encounter for induction of labor 04/11/2020  . History of cesarean section, low transverse 04/11/2020    Date of discharge: 04/13/2020   Discharge diagnosis: Principal Problem:   Postpartum care following vaginal delivery 2/12 Active Problems:   Encounter for induction of labor   History of cesarean section, low transverse   VBAC (vaginal birth after Cesarean) 2/12   Second degree perineal laceration   Maternal anemia, with delivery                                                          Post partum procedures:None  Augmentation: AROM, Pitocin and IP Foley Pain control: Epidural  Laceration:2nd degree;Labial  Episiotomy:None  Complications: None  Hospital course:  Induction of Labor With Vaginal Delivery   35 y.o. yo G2P2002 at [redacted]w[redacted]d was admitted to the hospital 04/11/2020 for induction of labor.  Indication for induction: TOLAC.  Patient had an uncomplicated labor course as follows: Membrane Rupture Time/Date: 7:55 PM ,04/11/2020   Delivery Method:VBAC, Spontaneous  Episiotomy: None  Lacerations:  2nd degree;Labial  Details of delivery can be found in separate delivery note.  Patient had a routine postpartum course. Patient is anemic but asymptomatic. She will start on PO iron and mag oxide and continue for 6 weeks postpartum. Patient is discharged home 04/13/20.  Newborn Data: Birth date:04/12/2020  Birth time:5:02 AM  Gender:Female  Living  status:Living  Apgars:8 ,9  Weight:4026 g   Physical exam  Vitals:   04/12/20 0850 04/12/20 1200 04/12/20 1600 04/12/20 2000  BP: 113/74 113/67 120/69 117/65  Pulse: 71 71 67 70  Resp: 18 18 18 18   Temp:    99 F (37.2 C)  TempSrc:    Oral  SpO2:    98%  Weight:      Height:       General: alert, cooperative and no distress Lochia: appropriate Uterine Fundus: firm Perineum: repair intact, no edema DVT Evaluation: No evidence of DVT seen on physical exam. No significant calf/ankle edema. Labs: Lab Results  Component Value Date   WBC 15.5 (H) 04/13/2020   HGB 8.8 (L) 04/13/2020   HCT 26.2 (L) 04/13/2020   MCV 96.3 04/13/2020   PLT 152 04/13/2020   CMP Latest Ref Rng & Units 09/07/2018  Glucose 70 - 99 mg/dL 90  BUN 6 - 23 mg/dL 16  Creatinine 0.40 - 1.20 mg/dL 0.76  Sodium 135 - 145 mEq/L 137  Potassium 3.5 - 5.1 mEq/L 4.3  Chloride 96 - 112 mEq/L 103  CO2 19 - 32 mEq/L 27  Calcium 8.4 - 10.5 mg/dL 9.2  Total Protein 6.0 - 8.3 g/dL 7.2  Total Bilirubin 0.2 - 1.2 mg/dL 0.6  Alkaline Phos 39 - 117 U/L 61  AST 0 - 37 U/L 15  ALT 0 - 35 U/L 10   Edinburgh  Postnatal Depression Scale Screening Tool 04/13/2020 04/20/2017  I have been able to laugh and see the funny side of things. 0 0  I have looked forward with enjoyment to things. 0 0  I have blamed myself unnecessarily when things went wrong. 1 1  I have been anxious or worried for no good reason. 1 0  I have felt scared or panicky for no good reason. 0 0  Things have been getting on top of me. 0 0  I have been so unhappy that I have had difficulty sleeping. 0 0  I have felt sad or miserable. 0 1  I have been so unhappy that I have been crying. 0 0  The thought of harming myself has occurred to me. 0 0  Edinburgh Postnatal Depression Scale Total 2 2   Vaccines: TDaP UTD         Flu    UTD         COVID-19   UTD  Discharge instructions:  per After Visit Summary  After Visit Meds:  Allergies as of 04/13/2020       Reactions   Other Other (See Comments)   Honeycomb bandage caused blistering       Medication List    STOP taking these medications   ferrous sulfate 325 (65 FE) MG tablet   magnesium oxide 400 MG tablet Commonly known as: MAG-OX     TAKE these medications   acetaminophen 325 MG tablet Commonly known as: Tylenol Take 2 tablets (650 mg total) by mouth every 4 (four) hours as needed (for pain scale < 4).   benzocaine-Menthol 20-0.5 % Aero Commonly known as: DERMOPLAST Apply 1 application topically as needed for irritation (perineal discomfort).   coconut oil Oil Apply 1 application topically as needed.   ibuprofen 600 MG tablet Commonly known as: ADVIL Take 1 tablet (600 mg total) by mouth every 6 (six) hours.   iron polysaccharides 150 MG capsule Commonly known as: Ferrex 150 Take 1 capsule (150 mg total) by mouth daily.   magnesium oxide 400 (241.3 Mg) MG tablet Commonly known as: MAG-OX Take 1 tablet (400 mg total) by mouth daily.   prenatal multivitamin Tabs tablet Take 1 tablet by mouth at bedtime.      Diet: routine diet  Activity: Advance as tolerated. Pelvic rest for 6 weeks.   Newborn Data: Live born female  Birth Weight: 8 lb 14 oz (4026 g) APGAR: 71, 9  Newborn Delivery   Birth date/time: 04/12/2020 05:02:00 Delivery type: VBAC, Spontaneous     Named Shawn Route Baby Feeding: Breast Disposition:home with mother  Delivery Report:   Review the Delivery Report for details.    Follow up:  Follow-up Information    Suzan Nailer, CNM. Schedule an appointment as soon as possible for a visit in 6 week(s).   Specialty: Certified Nurse Midwife Why: Please make an appointment for 6 weeks postpartum.  Contact information: [place] [id number] [place], CNM, MSN 04/13/2020, 10:28 AM

## 2020-04-13 NOTE — Plan of Care (Signed)
Discharge teaching given with after visit summary, pt receptive to teaching.

## 2020-04-13 NOTE — Anesthesia Postprocedure Evaluation (Signed)
Anesthesia Post Note  Patient: Michelle Lang  Procedure(s) Performed: AN AD [place]     Patient location during evaluation: Mother Baby Anesthesia Type: Epidural Level of consciousness: awake, oriented, awake and alert and patient cooperative Pain management: satisfactory to patient Vital Signs Assessment: post-procedure vital signs reviewed and stable Respiratory status: spontaneous breathing, respiratory function stable and nonlabored ventilation Cardiovascular status: stable Postop Assessment: no apparent nausea or vomiting and able to ambulate Anesthetic complications: no Comments: Spoke with patient during epidural rounds and patient complained of severe headache and neck pain while ambulating. I spoke to Dr. Valma Cava who went at 10 am to talk to patient. Refer to his note for further documentation.   No complications documented.  Last Vitals:  Vitals:   04/12/20 1600 04/12/20 2000  BP: 120/69 117/65  Pulse: 67 70  Resp: 18 18  Temp:  37.2 C  SpO2:  98%    Last Pain:  Vitals:   04/13/20 0930  TempSrc:   PainSc: 1    Pain Goal: Patients Stated Pain Goal: 1 (04/12/20 0313)                 Willa Rough

## 2020-04-13 NOTE — Progress Notes (Signed)
Returned to see patient. Stable dressed  ready to go home. Declined blood patch at this time. Pt told to call and return if Headache more severe or does not resolve.   Ishmael Holter. Davelyn Gwinn M.D.

## 2020-05-05 MED FILL — HUMIRA PEN 40 MG/0.4ML PNKT: 40 | 28 days supply | Qty: 2 | Fill #2

## 2020-05-27 ENCOUNTER — Other Ambulatory Visit (HOSPITAL_COMMUNITY): Payer: Self-pay

## 2020-06-05 ENCOUNTER — Other Ambulatory Visit (HOSPITAL_COMMUNITY): Payer: Self-pay

## 2020-06-09 ENCOUNTER — Other Ambulatory Visit (HOSPITAL_COMMUNITY): Payer: Self-pay

## 2020-06-09 MED ORDER — HUMIRA (2 PEN) 40 MG/0.4ML ~~LOC~~ AJKT
AUTO-INJECTOR | SUBCUTANEOUS | 2 refills | Status: DC
  Filled 2020-06-09: qty 2, 28d supply, fill #0
  Filled 2020-07-04: qty 2, 28d supply, fill #1

## 2020-06-12 ENCOUNTER — Other Ambulatory Visit (HOSPITAL_COMMUNITY): Payer: Self-pay

## 2020-06-16 DIAGNOSIS — M6281 Muscle weakness (generalized): Secondary | ICD-10-CM | POA: Diagnosis not present

## 2020-07-04 ENCOUNTER — Other Ambulatory Visit (HOSPITAL_COMMUNITY): Payer: Self-pay

## 2020-07-08 ENCOUNTER — Other Ambulatory Visit (HOSPITAL_COMMUNITY): Payer: Self-pay

## 2020-07-14 ENCOUNTER — Other Ambulatory Visit (HOSPITAL_COMMUNITY): Payer: Self-pay

## 2020-07-14 ENCOUNTER — Other Ambulatory Visit: Payer: Self-pay | Admitting: Pharmacist

## 2020-07-14 MED ORDER — HUMIRA (2 PEN) 40 MG/0.4ML ~~LOC~~ AJKT
AUTO-INJECTOR | SUBCUTANEOUS | 2 refills | Status: DC
  Filled 2020-07-19 – 2020-09-02 (×3): qty 2, 28d supply, fill #0
  Filled 2020-10-01: qty 2, 28d supply, fill #1
  Filled 2020-10-27: qty 2, 28d supply, fill #2

## 2020-07-17 DIAGNOSIS — M6281 Muscle weakness (generalized): Secondary | ICD-10-CM | POA: Diagnosis not present

## 2020-07-18 DIAGNOSIS — Z6824 Body mass index (BMI) 24.0-24.9, adult: Secondary | ICD-10-CM | POA: Diagnosis not present

## 2020-07-18 DIAGNOSIS — M0609 Rheumatoid arthritis without rheumatoid factor, multiple sites: Secondary | ICD-10-CM | POA: Diagnosis not present

## 2020-07-19 ENCOUNTER — Other Ambulatory Visit (HOSPITAL_COMMUNITY): Payer: Self-pay

## 2020-07-23 ENCOUNTER — Other Ambulatory Visit: Payer: Self-pay

## 2020-07-23 ENCOUNTER — Ambulatory Visit: Payer: 59 | Attending: Family Medicine | Admitting: Pharmacist

## 2020-07-23 DIAGNOSIS — Z79899 Other long term (current) drug therapy: Secondary | ICD-10-CM

## 2020-07-23 NOTE — Progress Notes (Signed)
   S: Patient presents today for review of her specialty medication.  Patient is currently on Humira for rheumatoid arthritis. Patient is managed by Leigh Aurora for this.   Efficacy: continues to work well for her.   Dosing:  Rheumatoid arthritis: SubQ: 40 mg every other week (may continue methotrexate, other nonbiologic DMARDS, corticosteroids, NSAIDs, and/or analgesics); patients not taking concomitant methotrexate may increase dose to 40 mg every week  Drug-drug interactions: none  Screening: TB test: completed per patient Hepatitis: completed per patient  Monitoring: S/sx of infection: none CBC: regularly monitored by Dr. Melissa Noon office Other side effects: none  O:   Lab Results  Component Value Date   WBC 15.5 (H) 04/13/2020   HGB 8.8 (L) 04/13/2020   HCT 26.2 (L) 04/13/2020   MCV 96.3 04/13/2020   PLT 152 04/13/2020      Chemistry      Component Value Date/Time   NA 137 09/07/2018 1341   K 4.3 09/07/2018 1341   CL 103 09/07/2018 1341   CO2 27 09/07/2018 1341   BUN 16 09/07/2018 1341   CREATININE 0.76 09/07/2018 1341      Component Value Date/Time   CALCIUM 9.2 09/07/2018 1341   ALKPHOS 61 09/07/2018 1341   AST 15 09/07/2018 1341   ALT 10 09/07/2018 1341   BILITOT 0.6 09/07/2018 1341     A/P: 1. Medication review: Patient currently taking Humira for rheumatoid arthritis with no adverse effects. Patient has been on Humira previously and has no questions, aware of increased risk of infection and possible increased risk of malignancy and is closely follow by Dr. Amil Amen. No recommendations for any changes.   Benard Halsted, PharmD, [place], [place] [phone]

## 2020-07-24 DIAGNOSIS — M6281 Muscle weakness (generalized): Secondary | ICD-10-CM | POA: Diagnosis not present

## 2020-07-31 DIAGNOSIS — M6281 Muscle weakness (generalized): Secondary | ICD-10-CM | POA: Diagnosis not present

## 2020-08-06 DIAGNOSIS — R29898 Other symptoms and signs involving the musculoskeletal system: Secondary | ICD-10-CM | POA: Diagnosis not present

## 2020-08-07 DIAGNOSIS — M6281 Muscle weakness (generalized): Secondary | ICD-10-CM | POA: Diagnosis not present

## 2020-08-13 ENCOUNTER — Other Ambulatory Visit (HOSPITAL_COMMUNITY): Payer: Self-pay

## 2020-08-18 ENCOUNTER — Other Ambulatory Visit (HOSPITAL_COMMUNITY): Payer: Self-pay

## 2020-08-21 ENCOUNTER — Other Ambulatory Visit (HOSPITAL_COMMUNITY): Payer: Self-pay

## 2020-08-26 ENCOUNTER — Other Ambulatory Visit (HOSPITAL_COMMUNITY): Payer: Self-pay

## 2020-09-02 ENCOUNTER — Other Ambulatory Visit (HOSPITAL_COMMUNITY): Payer: Self-pay

## 2020-09-02 DIAGNOSIS — Z13220 Encounter for screening for lipoid disorders: Secondary | ICD-10-CM | POA: Diagnosis not present

## 2020-09-02 DIAGNOSIS — Z0000 Encounter for general adult medical examination without abnormal findings: Secondary | ICD-10-CM | POA: Diagnosis not present

## 2020-09-02 DIAGNOSIS — Z01411 Encounter for gynecological examination (general) (routine) with abnormal findings: Secondary | ICD-10-CM | POA: Diagnosis not present

## 2020-09-02 DIAGNOSIS — Z6823 Body mass index (BMI) 23.0-23.9, adult: Secondary | ICD-10-CM | POA: Diagnosis not present

## 2020-09-02 DIAGNOSIS — Z01419 Encounter for gynecological examination (general) (routine) without abnormal findings: Secondary | ICD-10-CM | POA: Diagnosis not present

## 2020-09-02 DIAGNOSIS — Z113 Encounter for screening for infections with a predominantly sexual mode of transmission: Secondary | ICD-10-CM | POA: Diagnosis not present

## 2020-09-02 DIAGNOSIS — Z131 Encounter for screening for diabetes mellitus: Secondary | ICD-10-CM | POA: Diagnosis not present

## 2020-09-02 DIAGNOSIS — Z124 Encounter for screening for malignant neoplasm of cervix: Secondary | ICD-10-CM | POA: Diagnosis not present

## 2020-09-02 DIAGNOSIS — Z1329 Encounter for screening for other suspected endocrine disorder: Secondary | ICD-10-CM | POA: Diagnosis not present

## 2020-09-15 DIAGNOSIS — M6281 Muscle weakness (generalized): Secondary | ICD-10-CM | POA: Diagnosis not present

## 2020-09-23 ENCOUNTER — Other Ambulatory Visit (HOSPITAL_COMMUNITY): Payer: Self-pay

## 2020-10-01 ENCOUNTER — Other Ambulatory Visit (HOSPITAL_COMMUNITY): Payer: Self-pay

## 2020-10-27 ENCOUNTER — Other Ambulatory Visit (HOSPITAL_COMMUNITY): Payer: Self-pay

## 2020-12-01 ENCOUNTER — Other Ambulatory Visit (HOSPITAL_COMMUNITY): Payer: Self-pay

## 2020-12-02 ENCOUNTER — Other Ambulatory Visit (HOSPITAL_COMMUNITY): Payer: Self-pay

## 2020-12-03 ENCOUNTER — Other Ambulatory Visit (HOSPITAL_COMMUNITY): Payer: Self-pay

## 2020-12-05 ENCOUNTER — Other Ambulatory Visit (HOSPITAL_COMMUNITY): Payer: Self-pay

## 2020-12-15 ENCOUNTER — Other Ambulatory Visit (HOSPITAL_COMMUNITY): Payer: Self-pay

## 2020-12-15 ENCOUNTER — Other Ambulatory Visit: Payer: Self-pay

## 2020-12-15 ENCOUNTER — Other Ambulatory Visit: Payer: Self-pay | Admitting: Pharmacist

## 2020-12-15 MED ORDER — HUMIRA (2 PEN) 40 MG/0.4ML ~~LOC~~ AJKT
AUTO-INJECTOR | SUBCUTANEOUS | 5 refills | Status: DC
  Filled 2020-12-15: qty 2, 28d supply, fill #0
  Filled 2021-01-08: qty 2, 28d supply, fill #1
  Filled 2021-02-24: qty 2, 28d supply, fill #2
  Filled 2021-03-23: qty 2, 28d supply, fill #3
  Filled 2021-04-27: qty 2, 28d supply, fill #4
  Filled 2021-05-19: qty 2, 28d supply, fill #5

## 2020-12-15 MED ORDER — HUMIRA (2 PEN) 40 MG/0.4ML ~~LOC~~ AJKT: AUTO-INJECTOR | SUBCUTANEOUS | 5 refills | Status: DC

## 2020-12-16 ENCOUNTER — Other Ambulatory Visit (HOSPITAL_COMMUNITY): Payer: Self-pay

## 2021-01-06 ENCOUNTER — Other Ambulatory Visit (HOSPITAL_COMMUNITY): Payer: Self-pay

## 2021-01-08 ENCOUNTER — Other Ambulatory Visit (HOSPITAL_COMMUNITY): Payer: Self-pay

## 2021-01-19 ENCOUNTER — Other Ambulatory Visit (HOSPITAL_COMMUNITY): Payer: Self-pay

## 2021-01-26 ENCOUNTER — Other Ambulatory Visit (HOSPITAL_COMMUNITY): Payer: Self-pay

## 2021-01-28 DIAGNOSIS — Z6824 Body mass index (BMI) 24.0-24.9, adult: Secondary | ICD-10-CM | POA: Diagnosis not present

## 2021-01-28 DIAGNOSIS — M0609 Rheumatoid arthritis without rheumatoid factor, multiple sites: Secondary | ICD-10-CM | POA: Diagnosis not present

## 2021-02-16 ENCOUNTER — Other Ambulatory Visit (HOSPITAL_COMMUNITY): Payer: Self-pay

## 2021-02-18 ENCOUNTER — Other Ambulatory Visit (HOSPITAL_COMMUNITY): Payer: Self-pay

## 2021-02-24 ENCOUNTER — Other Ambulatory Visit (HOSPITAL_COMMUNITY): Payer: Self-pay

## 2021-03-03 ENCOUNTER — Other Ambulatory Visit (HOSPITAL_COMMUNITY): Payer: Self-pay

## 2021-03-18 ENCOUNTER — Other Ambulatory Visit (HOSPITAL_COMMUNITY): Payer: Self-pay

## 2021-03-19 ENCOUNTER — Other Ambulatory Visit (HOSPITAL_COMMUNITY): Payer: Self-pay

## 2021-03-20 ENCOUNTER — Other Ambulatory Visit (HOSPITAL_COMMUNITY): Payer: Self-pay

## 2021-03-23 ENCOUNTER — Other Ambulatory Visit (HOSPITAL_COMMUNITY): Payer: Self-pay

## 2021-03-30 ENCOUNTER — Other Ambulatory Visit (HOSPITAL_COMMUNITY): Payer: Self-pay

## 2021-04-24 ENCOUNTER — Other Ambulatory Visit (HOSPITAL_COMMUNITY): Payer: Self-pay

## 2021-04-27 ENCOUNTER — Other Ambulatory Visit (HOSPITAL_COMMUNITY): Payer: Self-pay

## 2021-04-28 ENCOUNTER — Other Ambulatory Visit (HOSPITAL_COMMUNITY): Payer: Self-pay

## 2021-04-30 ENCOUNTER — Other Ambulatory Visit (HOSPITAL_COMMUNITY): Payer: Self-pay

## 2021-05-19 ENCOUNTER — Other Ambulatory Visit (HOSPITAL_COMMUNITY): Payer: Self-pay

## 2021-05-26 ENCOUNTER — Other Ambulatory Visit (HOSPITAL_COMMUNITY): Payer: Self-pay

## 2021-05-28 ENCOUNTER — Other Ambulatory Visit (HOSPITAL_COMMUNITY): Payer: Self-pay

## 2021-06-19 ENCOUNTER — Other Ambulatory Visit (HOSPITAL_COMMUNITY): Payer: Self-pay

## 2021-06-25 IMAGING — CT CT ABDOMEN AND PELVIS WITH CONTRAST
1 of 2 series · 14 of 32 positions shown, 19 images · IV contrast (APPLIED)
Comparison: None.

CLINICAL DATA: Abdominal pain, fever, right lower quadrant pain

EXAM:
CT ABDOMEN AND PELVIS WITH CONTRAST
TECHNIQUE: Multidetector CT imaging of the abdomen and pelvis was performed
using the standard protocol following bolus administration of
intravenous contrast.
CONTRAST:  100mL CMWA8K-CGG IOPAMIDOL (CMWA8K-CGG) INJECTION 61%,
additional oral enteric contrast

[Series 2: abd/pelvis w/cm · axial · 0.69mm/px · z∈[-444,-64]mm · 14 of 86 slices shown, 19 images]
[im 5/86  soft-tissue]
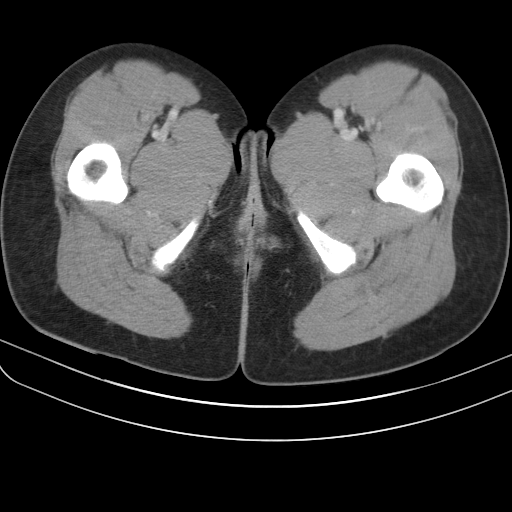
[im 5/86  bone]
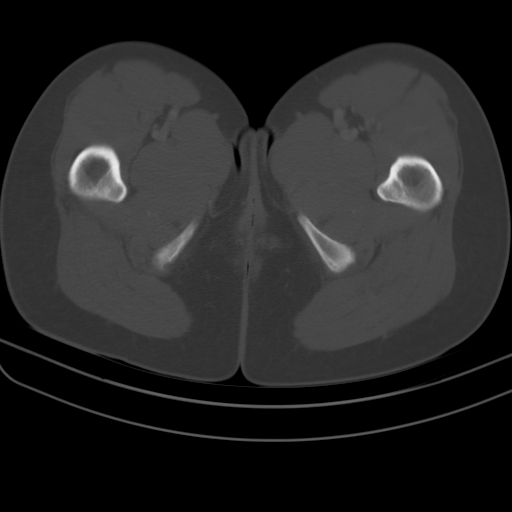
[im 13/86  soft-tissue]
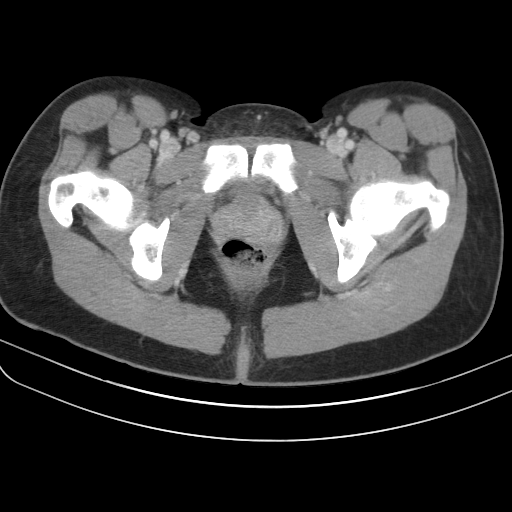
[im 18/86  soft-tissue]
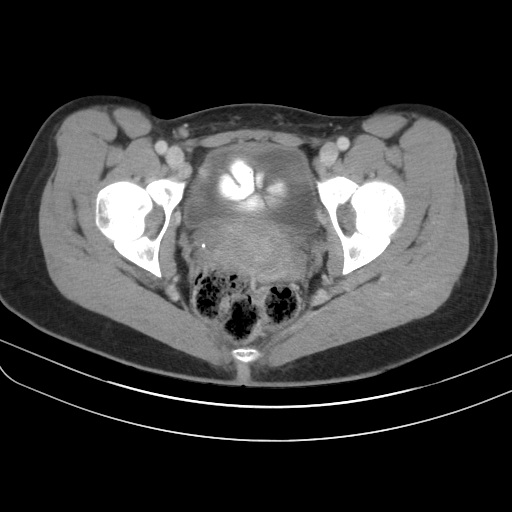
[im 26/86  soft-tissue]
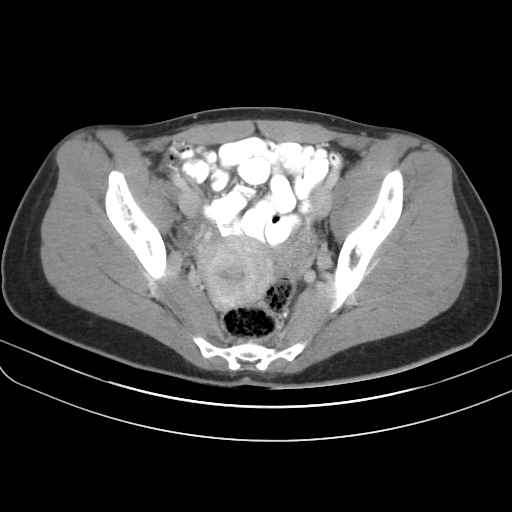
[im 30/86  soft-tissue]
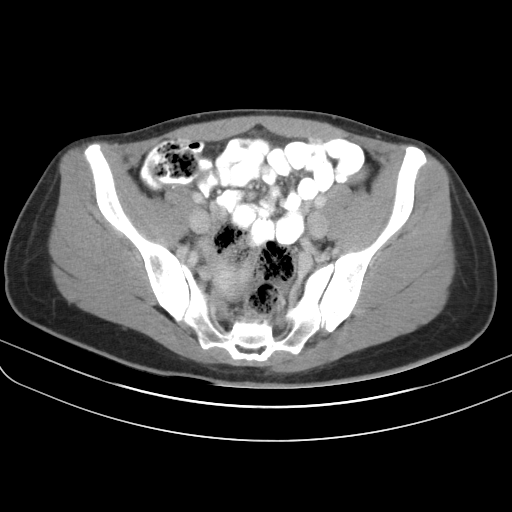
[im 39/86  soft-tissue]
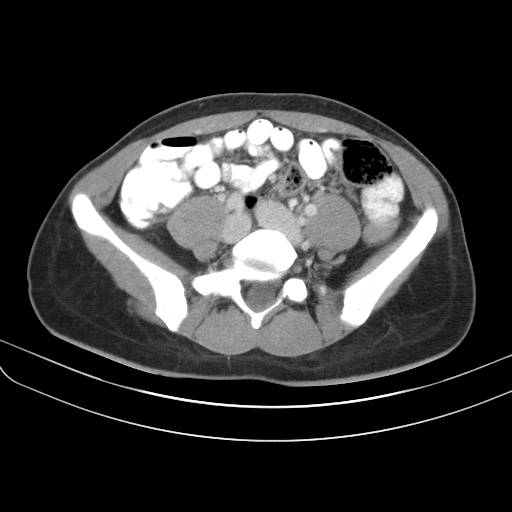
[im 43/86  soft-tissue]
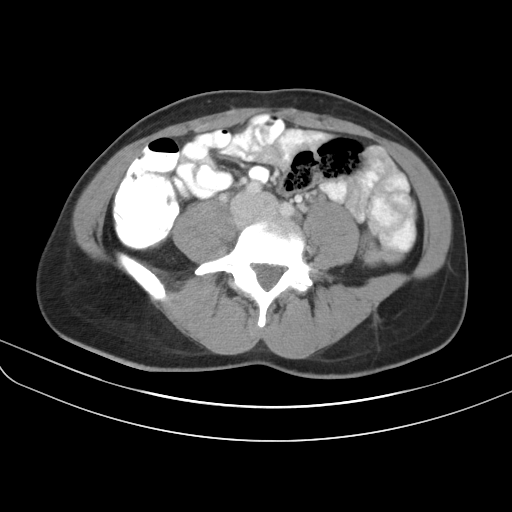
[im 47/86  soft-tissue]
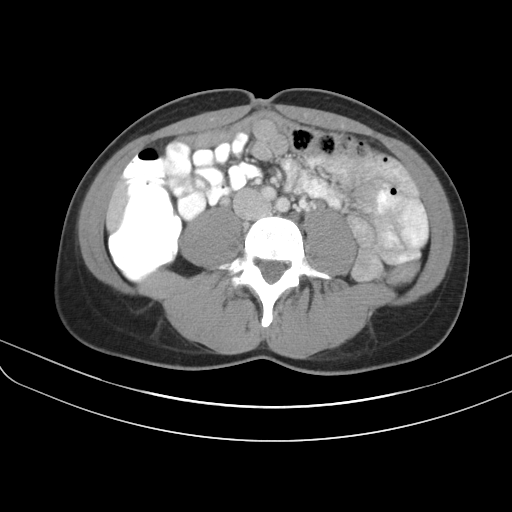
[im 56/86  soft-tissue]
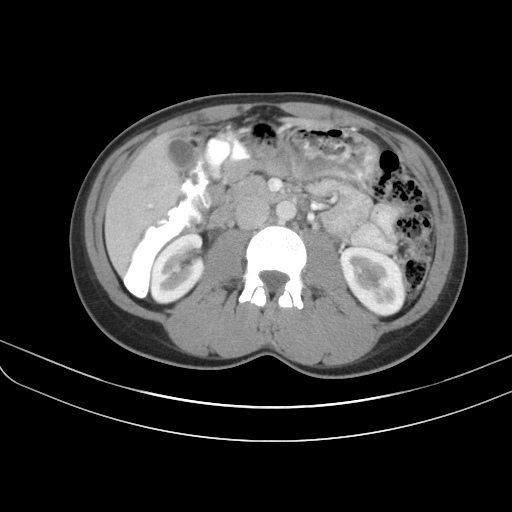
[im 56/86  bone]
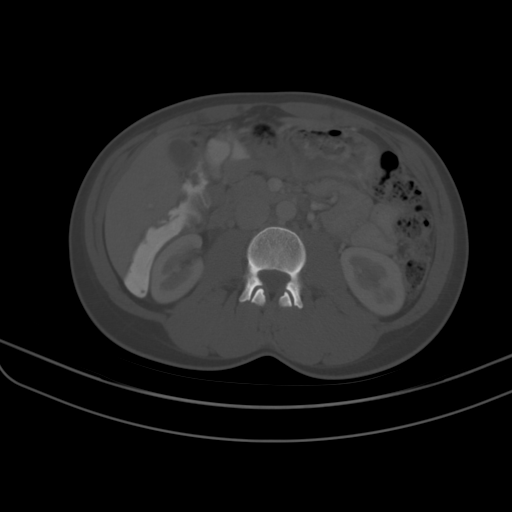
[im 60/86  soft-tissue]
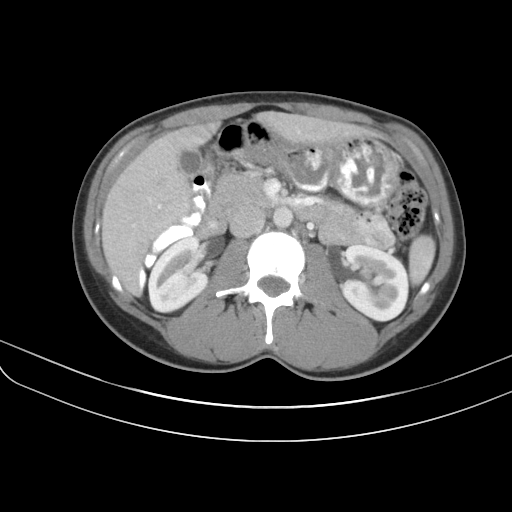
[im 69/86  soft-tissue]
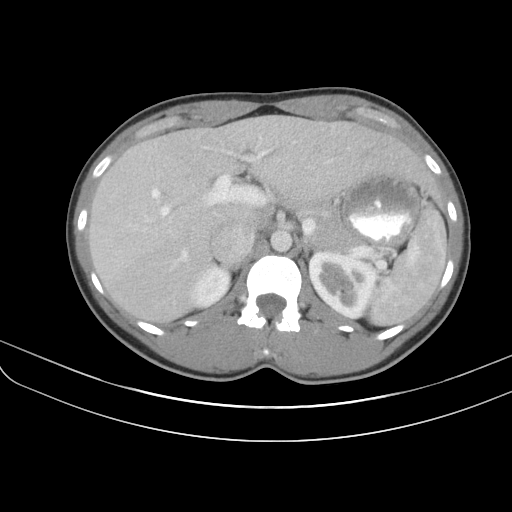
[im 69/86  lung]
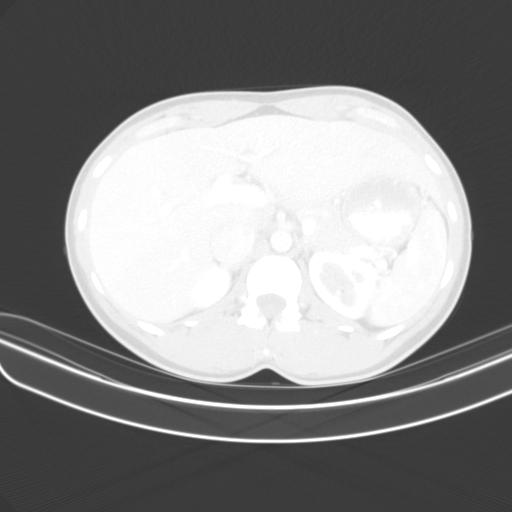
[im 73/86  soft-tissue]
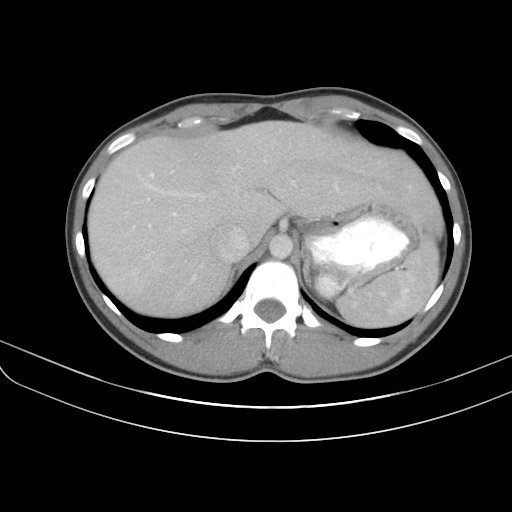
[im 73/86  lung]
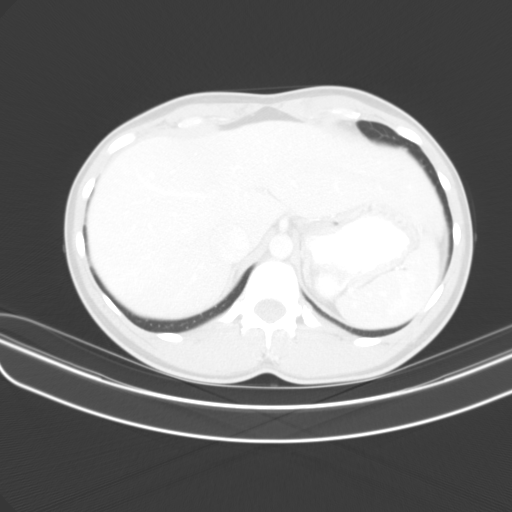
[im 77/86  lung]
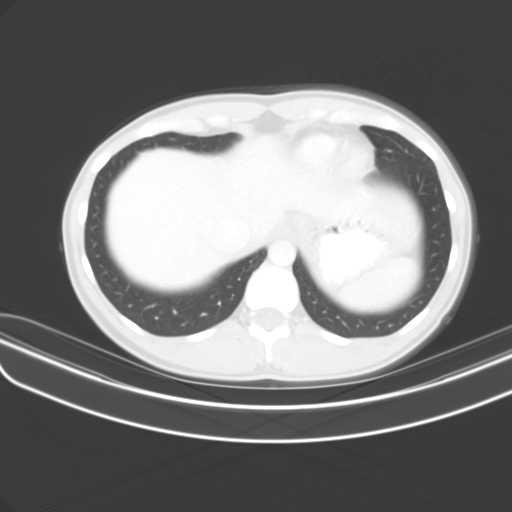
[im 81/86  soft-tissue]
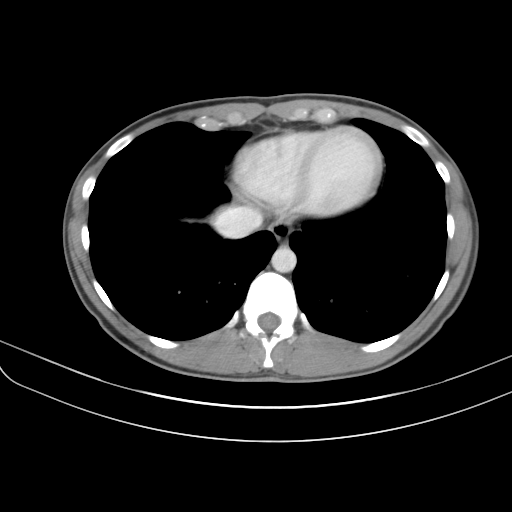
[im 81/86  lung]
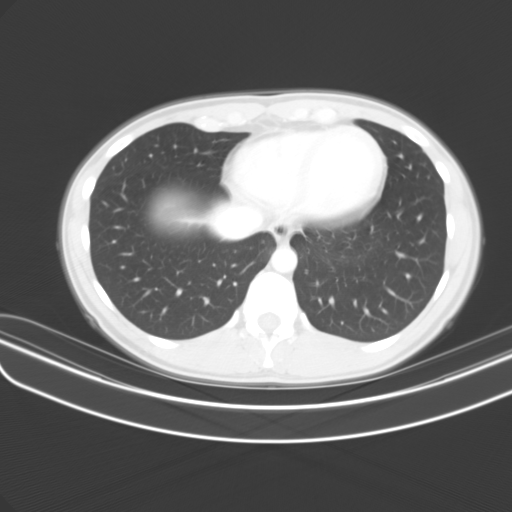

[14 of 32 positions shown; findings below may reference images not displayed]

FINDINGS: Lower chest: No acute abnormality.

Hepatobiliary: No solid liver abnormality is seen. No gallstones,
gallbladder wall thickening, or biliary dilatation.

Pancreas: Unremarkable. No pancreatic ductal dilatation or
surrounding inflammatory changes.

Spleen: Normal in size without significant abnormality.

Adrenals/Urinary Tract: Adrenal glands are unremarkable. Kidneys are
normal, without renal calculi, solid lesion, or hydronephrosis.
Bladder is unremarkable.

Stomach/Bowel: Stomach is within normal limits. Appendix is
surgically absent. No evidence of bowel wall thickening, distention,
or inflammatory changes.

Vascular/Lymphatic: No significant vascular findings are present. No
enlarged abdominal or pelvic lymph nodes.

Reproductive: No mass or other significant abnormality. Small fluid
attenuation cysts or follicles of the ovaries.

Other: No abdominal wall hernia or abnormality. No abdominopelvic
ascites.

Musculoskeletal: No acute or significant osseous findings.
IMPRESSION: 1.  No acute CT findings of the abdomen or pelvis to explain pain.

2.  Status post appendectomy.

## 2021-06-30 DIAGNOSIS — H5213 Myopia, bilateral: Secondary | ICD-10-CM | POA: Diagnosis not present

## 2021-06-30 DIAGNOSIS — H52203 Unspecified astigmatism, bilateral: Secondary | ICD-10-CM | POA: Diagnosis not present

## 2021-07-06 ENCOUNTER — Other Ambulatory Visit (HOSPITAL_COMMUNITY): Payer: Self-pay

## 2021-07-06 ENCOUNTER — Ambulatory Visit: Payer: 59 | Attending: Internal Medicine | Admitting: Pharmacist

## 2021-07-06 DIAGNOSIS — Z79899 Other long term (current) drug therapy: Secondary | ICD-10-CM

## 2021-07-06 MED ORDER — HUMIRA (2 PEN) 40 MG/0.4ML ~~LOC~~ AJKT: AUTO-INJECTOR | SUBCUTANEOUS | 5 refills | Status: DC

## 2021-07-06 MED ORDER — HUMIRA (2 PEN) 40 MG/0.4ML ~~LOC~~ AJKT
AUTO-INJECTOR | SUBCUTANEOUS | 5 refills | Status: DC
  Filled 2021-07-06: qty 2, 28d supply, fill #0
  Filled 2021-07-31: qty 2, 28d supply, fill #1
  Filled 2021-08-31 – 2021-09-17 (×2): qty 2, 28d supply, fill #2
  Filled 2021-10-08: qty 2, 28d supply, fill #3
  Filled 2021-11-19: qty 2, 28d supply, fill #4
  Filled 2021-12-17 – 2022-02-01 (×2): qty 2, 28d supply, fill #5

## 2021-07-06 NOTE — Progress Notes (Signed)
? ?  S: ?Patient presents today for review of her specialty medication. ? ?Patient is currently on Humira for rheumatoid arthritis. Patient is managed by Leigh Aurora for this.  ? ?Efficacy: continues to work well for her.  ? ?Dosing:  ?Rheumatoid arthritis: SubQ: 40 mg every other week (may continue methotrexate, other nonbiologic DMARDS, corticosteroids, NSAIDs, and/or analgesics); patients not taking concomitant methotrexate may increase dose to 40 mg every week ? ?Drug-drug interactions: none ? ?Screening: ?TB test: completed per patient ?Hepatitis: completed per patient ? ?Monitoring: ?S/sx of infection: none ?CBC: regularly monitored by Dr. Melissa Noon office ?Other side effects: none ? ?O: ? ? ?Lab Results  ?Component Value Date  ? WBC 15.5 (H) 04/13/2020  ? HGB 8.8 (L) 04/13/2020  ? HCT 26.2 (L) 04/13/2020  ? MCV 96.3 04/13/2020  ? PLT 152 04/13/2020  ? ? ?  Chemistry   ?   ?Component Value Date/Time  ? NA 137 09/07/2018 1341  ? K 4.3 09/07/2018 1341  ? CL 103 09/07/2018 1341  ? CO2 27 09/07/2018 1341  ? BUN 16 09/07/2018 1341  ? CREATININE 0.76 09/07/2018 1341  ?    ?Component Value Date/Time  ? CALCIUM 9.2 09/07/2018 1341  ? ALKPHOS 61 09/07/2018 1341  ? AST 15 09/07/2018 1341  ? ALT 10 09/07/2018 1341  ? BILITOT 0.6 09/07/2018 1341  ?  ? ?A/P: ?1. Medication review: Patient currently taking Humira for rheumatoid arthritis with no adverse effects. Patient has been on Humira previously and has no questions, aware of increased risk of infection and possible increased risk of malignancy and is closely follow by Dr. Amil Amen. No recommendations for any changes.  ? ?Benard Halsted, PharmD, BCACP, CPP ?Clinical Pharmacist ?[place] ?[phone] ? ? ? ? ? ? ? ?

## 2021-07-07 ENCOUNTER — Other Ambulatory Visit (HOSPITAL_COMMUNITY): Payer: Self-pay

## 2021-07-08 ENCOUNTER — Other Ambulatory Visit (HOSPITAL_COMMUNITY): Payer: Self-pay

## 2021-07-09 ENCOUNTER — Other Ambulatory Visit (HOSPITAL_COMMUNITY): Payer: Self-pay

## 2021-07-24 ENCOUNTER — Other Ambulatory Visit (HOSPITAL_COMMUNITY): Payer: Self-pay

## 2021-07-29 DIAGNOSIS — M0609 Rheumatoid arthritis without rheumatoid factor, multiple sites: Secondary | ICD-10-CM | POA: Diagnosis not present

## 2021-07-29 DIAGNOSIS — Z6823 Body mass index (BMI) 23.0-23.9, adult: Secondary | ICD-10-CM | POA: Diagnosis not present

## 2021-07-31 ENCOUNTER — Other Ambulatory Visit (HOSPITAL_COMMUNITY): Payer: Self-pay

## 2021-08-25 ENCOUNTER — Other Ambulatory Visit (HOSPITAL_COMMUNITY): Payer: Self-pay

## 2021-08-31 ENCOUNTER — Other Ambulatory Visit (HOSPITAL_COMMUNITY): Payer: Self-pay

## 2021-09-01 ENCOUNTER — Other Ambulatory Visit (HOSPITAL_COMMUNITY): Payer: Self-pay

## 2021-09-02 ENCOUNTER — Other Ambulatory Visit (HOSPITAL_COMMUNITY): Payer: Self-pay

## 2021-09-07 ENCOUNTER — Other Ambulatory Visit (HOSPITAL_COMMUNITY): Payer: Self-pay

## 2021-09-17 ENCOUNTER — Other Ambulatory Visit (HOSPITAL_COMMUNITY): Payer: Self-pay

## 2021-10-08 ENCOUNTER — Other Ambulatory Visit (HOSPITAL_COMMUNITY): Payer: Self-pay

## 2021-10-19 ENCOUNTER — Other Ambulatory Visit (HOSPITAL_COMMUNITY): Payer: Self-pay

## 2021-11-17 ENCOUNTER — Other Ambulatory Visit (HOSPITAL_COMMUNITY): Payer: Self-pay

## 2021-11-19 ENCOUNTER — Other Ambulatory Visit (HOSPITAL_COMMUNITY): Payer: Self-pay

## 2021-11-27 ENCOUNTER — Other Ambulatory Visit (HOSPITAL_COMMUNITY): Payer: Self-pay

## 2021-12-10 ENCOUNTER — Other Ambulatory Visit (HOSPITAL_COMMUNITY): Payer: Self-pay

## 2021-12-15 ENCOUNTER — Other Ambulatory Visit (HOSPITAL_COMMUNITY): Payer: Self-pay

## 2021-12-17 ENCOUNTER — Other Ambulatory Visit (HOSPITAL_COMMUNITY): Payer: Self-pay

## 2021-12-20 ENCOUNTER — Telehealth: Payer: 59 | Admitting: Nurse Practitioner

## 2021-12-20 DIAGNOSIS — J020 Streptococcal pharyngitis: Secondary | ICD-10-CM | POA: Diagnosis not present

## 2021-12-20 MED ORDER — AMOXICILLIN 500 MG PO CAPS: 500.0000 mg | ORAL_CAPSULE | Freq: Two times a day (BID) | ORAL | 0 refills | Status: AC

## 2021-12-20 NOTE — Patient Instructions (Signed)
  Michelle Lang, thank you for joining Michelle Pounds, NP for today's virtual visit.  While this provider is not your primary care provider (PCP), if your PCP is located in our provider database this encounter information will be shared with them immediately following your visit.   [facility] account gives you access to today's visit and all your visits, tests, and labs performed at [facility] " click here if you don't have a [facility] account or go to [email or web address]  Consent: (Patient) Michelle Lang provided verbal consent for this virtual visit at the beginning of the encounter.  Current Medications:  Current Outpatient Medications:    Adalimumab (HUMIRA PEN) 40 MG/0.4ML PNKT, Inject '40mg'$  subcutaneously every 2 weeks, Disp: 2 each, Rfl: 5   ibuprofen (ADVIL) 600 MG tablet, Take 1 tablet (600 mg total) by mouth every 6 (six) hours., Disp: 30 tablet, Rfl: 0   Medications ordered in this encounter:  No orders of the defined types were placed in this encounter.    *If you need refills on other medications prior to your next appointment, please contact your pharmacy*  Follow-Up: Call back or seek an in-person evaluation if the symptoms worsen or if the condition fails to improve as anticipated.  [place] [phone]  Other Instructions Motrin or tylenol for throat pain as needed   If you have been instructed to have an in-person evaluation today at a local Urgent Care facility, please use the link below. It will take you to a list of all of our available [facility], including address, phone number and hours of operation. Please do not delay care.  [facility]  If you or a family member do not have a primary care provider, use the link below to schedule a visit and establish care. When you choose a [facility] primary care physician or advanced practice provider, you gain a  long-term partner in health. Find a Primary Care Provider  Learn more about [place] [facility] in-office and virtual care options: [facility]

## 2021-12-20 NOTE — Progress Notes (Signed)
Virtual Visit Consent   Michelle Lang, you are scheduled for a virtual visit with a [facility] provider today. Just as with appointments in the office, your consent must be obtained to participate. Your consent will be active for this visit and any virtual visit you may have with one of our providers in the next 365 days. If you have a MyChart account, a copy of this consent can be sent to you electronically.  As this is a virtual visit, video technology does not allow for your provider to perform a traditional examination. This may limit your provider's ability to fully assess your condition. If your provider identifies any concerns that need to be evaluated in person or the need to arrange testing (such as labs, EKG, etc.), we will make arrangements to do so. Although advances in technology are sophisticated, we cannot ensure that it will always work on either your end or our end. If the connection with a video visit is poor, the visit may have to be switched to a telephone visit. With either a video or telephone visit, we are not always able to ensure that we have a secure connection.  By engaging in this virtual visit, you consent to the provision of healthcare and authorize for your insurance to be billed (if applicable) for the services provided during this visit. Depending on your insurance coverage, you may receive a charge related to this service.  I need to obtain your verbal consent now. Are you willing to proceed with your visit today? Tanija Germani Michelle Lang has provided verbal consent on 12/20/2021 for a virtual visit (video or telephone). Gildardo Pounds, NP  Date: 12/20/2021 9:22 AM  Virtual Visit via Video Note   I, Gildardo Pounds, connected with  Michelle Lang  ([id number], 1985/10/13) on 12/20/21 at  9:15 AM EDT by a video-enabled telemedicine application and verified that I am speaking with the correct person using two identifiers.  Location: Patient:  Virtual Visit Location Patient: Home Provider: Virtual Visit Location Provider: Home Office   I discussed the limitations of evaluation and management by telemedicine and the availability of in person appointments. The patient expressed understanding and agreed to proceed.    History of Present Illness: Michelle Lang is a 36 y.o. who identifies as a female who was assigned female at birth, and is being seen today for bacterial pharyngitis.  Patient complains of sore throat. Associated symptoms include suspected fevers but not measured at home, enlarged tonsils, sore throat, swollen glands, and white spots in throat.Onset of symptoms was a few days ago, unchanged since that time. She is drinking plenty of fluids. She has had recent close exposure to someone with proven streptococcal pharyngitis.    Problems:  Patient Active Problem List   Diagnosis Date Noted   Maternal anemia, with delivery 04/13/2020   VBAC (vaginal birth after Cesarean) 2/12 04/12/2020   Postpartum care following vaginal delivery 2/12 04/12/2020   Second degree perineal laceration 04/12/2020   Encounter for induction of labor 04/11/2020   History of cesarean section, low transverse 04/11/2020    Allergies:  Allergies  Allergen Reactions   Other Other (See Comments)    Honeycomb bandage caused blistering    Medications:  Current Outpatient Medications:    amoxicillin (AMOXIL) 500 MG capsule, Take 1 capsule (500 mg total) by mouth 2 (two) times daily for 10 days., Disp: 20 capsule, Rfl: 0   Adalimumab (HUMIRA PEN) 40 MG/0.4ML PNKT, Inject  $'40mg'f$  subcutaneously every 2 weeks, Disp: 2 each, Rfl: 5   ibuprofen (ADVIL) 600 MG tablet, Take 1 tablet (600 mg total) by mouth every 6 (six) hours., Disp: 30 tablet, Rfl: 0  Observations/Objective: Patient is well-developed, well-nourished in no acute distress.  Resting comfortably at home.  Head is normocephalic, atraumatic.  No labored breathing.  Speech is  clear and coherent with logical content.  Patient is alert and oriented at baseline.    Assessment and Plan: 1. Strep throat - amoxicillin (AMOXIL) 500 MG capsule; Take 1 capsule (500 mg total) by mouth 2 (two) times daily for 10 days.  Dispense: 20 capsule; Refill: 0 Motrin or tylenol for sore throat pain relief  Follow Up Instructions: I discussed the assessment and treatment plan with the patient. The patient was provided an opportunity to ask questions and all were answered. The patient agreed with the plan and demonstrated an understanding of the instructions.  A copy of instructions were sent to the patient via MyChart unless otherwise noted below.     The patient was advised to call back or seek an in-person evaluation if the symptoms worsen or if the condition fails to improve as anticipated.  Time:  I spent 11 minutes with the patient via telehealth technology discussing the above problems/concerns.    Gildardo Pounds, NP

## 2022-01-04 ENCOUNTER — Other Ambulatory Visit (HOSPITAL_COMMUNITY): Payer: Self-pay

## 2022-01-04 DIAGNOSIS — Z111 Encounter for screening for respiratory tuberculosis: Secondary | ICD-10-CM | POA: Diagnosis not present

## 2022-01-04 DIAGNOSIS — Z6822 Body mass index (BMI) 22.0-22.9, adult: Secondary | ICD-10-CM | POA: Diagnosis not present

## 2022-01-04 DIAGNOSIS — M0609 Rheumatoid arthritis without rheumatoid factor, multiple sites: Secondary | ICD-10-CM | POA: Diagnosis not present

## 2022-01-13 DIAGNOSIS — R509 Fever, unspecified: Secondary | ICD-10-CM | POA: Diagnosis not present

## 2022-01-13 DIAGNOSIS — R0981 Nasal congestion: Secondary | ICD-10-CM | POA: Diagnosis not present

## 2022-01-13 DIAGNOSIS — R051 Acute cough: Secondary | ICD-10-CM | POA: Diagnosis not present

## 2022-01-13 DIAGNOSIS — R52 Pain, unspecified: Secondary | ICD-10-CM | POA: Diagnosis not present

## 2022-01-13 DIAGNOSIS — Z03818 Encounter for observation for suspected exposure to other biological agents ruled out: Secondary | ICD-10-CM | POA: Diagnosis not present

## 2022-01-13 DIAGNOSIS — R059 Cough, unspecified: Secondary | ICD-10-CM | POA: Diagnosis not present

## 2022-01-18 ENCOUNTER — Telehealth: Payer: 59 | Admitting: Physician Assistant

## 2022-01-18 DIAGNOSIS — J019 Acute sinusitis, unspecified: Secondary | ICD-10-CM

## 2022-01-18 DIAGNOSIS — B9689 Other specified bacterial agents as the cause of diseases classified elsewhere: Secondary | ICD-10-CM

## 2022-01-18 MED ORDER — AMOXICILLIN-POT CLAVULANATE 875-125 MG PO TABS: 1.0000 | ORAL_TABLET | Freq: Two times a day (BID) | ORAL | 0 refills | Status: DC

## 2022-01-18 NOTE — Patient Instructions (Signed)
Michelle Lang, thank you for joining Mar Daring, PA-C for today's virtual visit.  While this provider is not your primary care provider (PCP), if your PCP is located in our provider database this encounter information will be shared with them immediately following your visit.   [place] account gives you access to today's visit and all your visits, tests, and labs performed at [facility] " click here if you don't have a [facility] account or go to [email or web address]  Consent: (Patient) Michelle Lang provided verbal consent for this virtual visit at the beginning of the encounter.  Current Medications:  Current Outpatient Medications:    Adalimumab (HUMIRA PEN) 40 MG/0.4ML PNKT, Inject '40mg'$  subcutaneously every 2 weeks, Disp: 2 each, Rfl: 5   amoxicillin-clavulanate (AUGMENTIN) 875-125 MG tablet, Take 1 tablet by mouth 2 (two) times daily., Disp: 20 tablet, Rfl: 0   ibuprofen (ADVIL) 600 MG tablet, Take 1 tablet (600 mg total) by mouth every 6 (six) hours., Disp: 30 tablet, Rfl: 0   Medications ordered in this encounter:  Meds ordered this encounter  Medications   amoxicillin-clavulanate (AUGMENTIN) 875-125 MG tablet    Sig: Take 1 tablet by mouth 2 (two) times daily.    Dispense:  20 tablet    Refill:  0    Order Specific Question:   Supervising Provider    Answer:   Chase Picket [id number]     *If you need refills on other medications prior to your next appointment, please contact your pharmacy*  Follow-Up: Call back or seek an in-person evaluation if the symptoms worsen or if the condition fails to improve as anticipated.  [place] [phone]  Other Instructions  Sinus Infection, Adult A sinus infection, also called sinusitis, is inflammation of your sinuses. Sinuses are hollow spaces in the bones around your face. Your sinuses are located: Around your eyes. In the middle of  your forehead. Behind your nose. In your cheekbones. Mucus normally drains out of your sinuses. When your nasal tissues become inflamed or swollen, mucus can become trapped or blocked. This allows bacteria, viruses, and fungi to grow, which leads to infection. Most infections of the sinuses are caused by a virus. A sinus infection can develop quickly. It can last for up to 4 weeks (acute) or for more than 12 weeks (chronic). A sinus infection often develops after a cold. What are the causes? This condition is caused by anything that creates swelling in the sinuses or stops mucus from draining. This includes: Allergies. Asthma. Infection from bacteria or viruses. Deformities or blockages in your nose or sinuses. Abnormal growths in the nose (nasal polyps). Pollutants, such as chemicals or irritants in the air. Infection from fungi. This is rare. What increases the risk? You are more likely to develop this condition if you: Have a weak body defense system (immune system). Do a lot of swimming or diving. Overuse nasal sprays. Smoke. What are the signs or symptoms? The main symptoms of this condition are pain and a feeling of pressure around the affected sinuses. Other symptoms include: Stuffy nose or congestion that makes it difficult to breathe through your nose. Thick yellow or greenish drainage from your nose. Tenderness, swelling, and warmth over the affected sinuses. A cough that may get worse at night. Decreased sense of smell and taste. Extra mucus that collects in the throat or the back of the nose (postnasal drip) causing a sore throat or bad  breath. Tiredness (fatigue). Fever. How is this diagnosed? This condition is diagnosed based on: Your symptoms. Your medical history. A physical exam. Tests to find out if your condition is acute or chronic. This may include: Checking your nose for nasal polyps. Viewing your sinuses using a device that has a light (endoscope). Testing  for allergies or bacteria. Imaging tests, such as an MRI or CT scan. In rare cases, a bone biopsy may be done to rule out more serious types of fungal sinus disease. How is this treated? Treatment for a sinus infection depends on the cause and whether your condition is chronic or acute. If caused by a virus, your symptoms should go away on their own within 10 days. You may be given medicines to relieve symptoms. They include: Medicines that shrink swollen nasal passages (decongestants). A spray that eases inflammation of the nostrils (topical intranasal corticosteroids). Rinses that help get rid of thick mucus in your nose (nasal saline washes). Medicines that treat allergies (antihistamines). Over-the-counter pain relievers. If caused by bacteria, your health care provider may recommend waiting to see if your symptoms improve. Most bacterial infections will get better without antibiotic medicine. You may be given antibiotics if you have: A severe infection. A weak immune system. If caused by narrow nasal passages or nasal polyps, surgery may be needed. Follow these instructions at home: Medicines Take, use, or apply over-the-counter and prescription medicines only as told by your health care provider. These may include nasal sprays. If you were prescribed an antibiotic medicine, take it as told by your health care provider. Do not stop taking the antibiotic even if you start to feel better. Hydrate and humidify  Drink enough fluid to keep your urine pale yellow. Staying hydrated will help to thin your mucus. Use a cool mist humidifier to keep the humidity level in your home above 50%. Inhale steam for 10-15 minutes, 3-4 times a day, or as told by your health care provider. You can do this in the bathroom while a hot shower is running. Limit your exposure to cool or dry air. Rest Rest as much as possible. Sleep with your head raised (elevated). Make sure you get enough sleep each  night. General instructions  Apply a warm, moist washcloth to your face 3-4 times a day or as told by your health care provider. This will help with discomfort. Use nasal saline washes as often as told by your health care provider. Wash your hands often with soap and water to reduce your exposure to germs. If soap and water are not available, use hand sanitizer. Do not smoke. Avoid being around people who are smoking (secondhand smoke). Keep all follow-up visits. This is important. Contact a health care provider if: You have a fever. Your symptoms get worse. Your symptoms do not improve within 10 days. Get help right away if: You have a severe headache. You have persistent vomiting. You have severe pain or swelling around your face or eyes. You have vision problems. You develop confusion. Your neck is stiff. You have trouble breathing. These symptoms may be an emergency. Get help right away. Call 911. Do not wait to see if the symptoms will go away. Do not drive yourself to the hospital. Summary A sinus infection is soreness and inflammation of your sinuses. Sinuses are hollow spaces in the bones around your face. This condition is caused by nasal tissues that become inflamed or swollen. The swelling traps or blocks the flow of mucus. This allows bacteria,  viruses, and fungi to grow, which leads to infection. If you were prescribed an antibiotic medicine, take it as told by your health care provider. Do not stop taking the antibiotic even if you start to feel better. Keep all follow-up visits. This is important. This information is not intended to replace advice given to you by your health care provider. Make sure you discuss any questions you have with your health care provider. Document Revised: 01/20/2021 Document Reviewed: 01/20/2021 Elsevier Patient Education  [place].    If you have been instructed to have an in-person evaluation today at a local Urgent Care  facility, please use the link below. It will take you to a list of all of our available [facility], including address, phone number and hours of operation. Please do not delay care.  [facility]  If you or a family member do not have a primary care provider, use the link below to schedule a visit and establish care. When you choose a [facility] primary care physician or advanced practice provider, you gain a long-term partner in health. Find a Primary Care Provider  Learn more about [facility] in-office and virtual care options: [facility] Now

## 2022-01-18 NOTE — Progress Notes (Signed)
Virtual Visit Consent   Michelle Lang, you are scheduled for a virtual visit with a [facility] provider today. Just as with appointments in the office, your consent must be obtained to participate. Your consent will be active for this visit and any virtual visit you may have with one of our providers in the next 365 days. If you have a MyChart account, a copy of this consent can be sent to you electronically.  As this is a virtual visit, video technology does not allow for your provider to perform a traditional examination. This may limit your provider's ability to fully assess your condition. If your provider identifies any concerns that need to be evaluated in person or the need to arrange testing (such as labs, EKG, etc.), we will make arrangements to do so. Although advances in technology are sophisticated, we cannot ensure that it will always work on either your end or our end. If the connection with a video visit is poor, the visit may have to be switched to a telephone visit. With either a video or telephone visit, we are not always able to ensure that we have a secure connection.  By engaging in this virtual visit, you consent to the provision of healthcare and authorize for your insurance to be billed (if applicable) for the services provided during this visit. Depending on your insurance coverage, you may receive a charge related to this service.  I need to obtain your verbal consent now. Are you willing to proceed with your visit today? [name] [name] has provided verbal consent on 01/18/2022 for a virtual visit (video or telephone). Mar Daring, PA-C  Date: 01/18/2022 5:41 PM  Virtual Visit via Video Note   I, Mar Daring, connected with  Michelle Lang Michelle Lang  ([id number], [birth year]) on 01/18/22 at  5:45 PM EST by a video-enabled telemedicine application and verified that I am speaking with the correct person using two  identifiers.  Location: Patient: Virtual Visit Location Patient: Home Provider: Virtual Visit Location Provider: Home Office   I discussed the limitations of evaluation and management by telemedicine and the availability of in person appointments. The patient expressed understanding and agreed to proceed.    History of Present Illness: Michelle Lang is a 36 y.o. who identifies as a female who was assigned female at birth, and is being seen today for possible sinus infection.  HPI: Sinusitis This is a new problem. The current episode started in the past 7 days (Symptoms started Monday, 01/11/22; this followed a URI symptoms that week prior). The problem has been gradually worsening since onset. The maximum temperature recorded prior to her arrival was 103 - 104 F (103.6). The fever has been present for 5 days or more. The pain is moderate. Associated symptoms include chills, congestion, coughing, diaphoresis, ear pain (with blowing nose), headaches, sinus pressure and a sore throat (dry and scratchy overnight). (Had TTP over frontal sinuses ) Treatments tried: ibuprofen and tylenol fever returns immediately at wearing off. The treatment provided no relief.    Did go to a walk-in clinic at [facility] and was tested for influenza, covid and rsv, all negative.  Problems:  Patient Active Problem List   Diagnosis Date Noted   Maternal anemia, with delivery 04/13/2020   VBAC (vaginal birth after Cesarean) 2/12 04/12/2020   Postpartum care following vaginal delivery 2/12 04/12/2020   Second degree perineal laceration 04/12/2020   Encounter for induction of labor 04/11/2020  History of cesarean section, low transverse 04/11/2020    Allergies:  Allergies  Allergen Reactions   Other Other (See Comments)    Honeycomb bandage caused blistering    Medications:  Current Outpatient Medications:    Adalimumab (HUMIRA PEN) 40 MG/0.4ML PNKT, Inject '40mg'$  subcutaneously every 2 weeks, Disp: 2  each, Rfl: 5   amoxicillin-clavulanate (AUGMENTIN) 875-125 MG tablet, Take 1 tablet by mouth 2 (two) times daily., Disp: 20 tablet, Rfl: 0   ibuprofen (ADVIL) 600 MG tablet, Take 1 tablet (600 mg total) by mouth every 6 (six) hours., Disp: 30 tablet, Rfl: 0  Observations/Objective: Patient is well-developed, well-nourished in no acute distress.  Resting comfortably at home.  Head is normocephalic, atraumatic.  No labored breathing.  Speech is clear and coherent with logical content.  Patient is alert and oriented at baseline.    Assessment and Plan: 1. Acute bacterial sinusitis - amoxicillin-clavulanate (AUGMENTIN) 875-125 MG tablet; Take 1 tablet by mouth 2 (two) times daily.  Dispense: 20 tablet; Refill: 0  - Worsening symptoms that have not responded to OTC medications.  - Will give Augmentin - Continue allergy medications.  - Steam and humidifier can help - Stay well hydrated and get plenty of rest.  - Seek in person evaluation if no symptom improvement or if symptoms worsen   Follow Up Instructions: I discussed the assessment and treatment plan with the patient. The patient was provided an opportunity to ask questions and all were answered. The patient agreed with the plan and demonstrated an understanding of the instructions.  A copy of instructions were sent to the patient via MyChart unless otherwise noted below.    The patient was advised to call back or seek an in-person evaluation if the symptoms worsen or if the condition fails to improve as anticipated.  Time:  I spent 12 minutes with the patient via telehealth technology discussing the above problems/concerns.    Mar Daring, PA-C

## 2022-01-20 ENCOUNTER — Other Ambulatory Visit (HOSPITAL_COMMUNITY): Payer: Self-pay

## 2022-01-28 ENCOUNTER — Other Ambulatory Visit (HOSPITAL_COMMUNITY): Payer: Self-pay

## 2022-02-01 ENCOUNTER — Other Ambulatory Visit (HOSPITAL_COMMUNITY): Payer: Self-pay

## 2022-02-16 ENCOUNTER — Other Ambulatory Visit: Payer: Self-pay

## 2022-02-16 ENCOUNTER — Other Ambulatory Visit (HOSPITAL_COMMUNITY): Payer: Self-pay

## 2022-02-16 ENCOUNTER — Other Ambulatory Visit: Payer: Self-pay | Admitting: Pharmacist

## 2022-02-16 MED ORDER — HUMIRA (2 PEN) 40 MG/0.4ML ~~LOC~~ AJKT: AUTO-INJECTOR | SUBCUTANEOUS | 5 refills | Status: DC

## 2022-02-16 MED ORDER — HUMIRA (2 PEN) 40 MG/0.4ML ~~LOC~~ AJKT
AUTO-INJECTOR | SUBCUTANEOUS | 5 refills | Status: DC
Start: 2022-02-16 — End: 2022-08-27
  Filled 2022-02-16 (×2): qty 2, 28d supply, fill #0
  Filled 2022-03-22: qty 2, 28d supply, fill #1
  Filled 2022-04-23: qty 2, 28d supply, fill #2
  Filled 2022-05-26: qty 2, 28d supply, fill #3
  Filled 2022-06-29: qty 2, 28d supply, fill #4
  Filled 2022-08-02: qty 2, 28d supply, fill #5

## 2022-02-17 ENCOUNTER — Other Ambulatory Visit (HOSPITAL_COMMUNITY): Payer: Self-pay

## 2022-02-19 ENCOUNTER — Other Ambulatory Visit (HOSPITAL_COMMUNITY): Payer: Self-pay

## 2022-02-23 ENCOUNTER — Other Ambulatory Visit: Payer: Self-pay

## 2022-02-24 ENCOUNTER — Other Ambulatory Visit (HOSPITAL_COMMUNITY): Payer: Self-pay

## 2022-03-12 ENCOUNTER — Other Ambulatory Visit (HOSPITAL_BASED_OUTPATIENT_CLINIC_OR_DEPARTMENT_OTHER): Payer: Self-pay

## 2022-03-12 MED ORDER — COMIRNATY 30 MCG/0.3ML IM SUSY
PREFILLED_SYRINGE | INTRAMUSCULAR | 0 refills | Status: AC
  Filled 2022-03-12: qty 0.3, 1d supply, fill #0

## 2022-03-12 MED ORDER — FLUARIX QUADRIVALENT 0.5 ML IM SUSY
PREFILLED_SYRINGE | INTRAMUSCULAR | 0 refills | Status: DC
  Filled 2022-03-12: qty 0.5, 1d supply, fill #0

## 2022-03-16 ENCOUNTER — Other Ambulatory Visit (HOSPITAL_COMMUNITY): Payer: Self-pay

## 2022-03-22 ENCOUNTER — Other Ambulatory Visit (HOSPITAL_COMMUNITY): Payer: Self-pay

## 2022-03-22 ENCOUNTER — Ambulatory Visit (INDEPENDENT_AMBULATORY_CARE_PROVIDER_SITE_OTHER): Payer: 59 | Admitting: Family Medicine

## 2022-03-22 ENCOUNTER — Other Ambulatory Visit: Payer: Self-pay

## 2022-03-22 VITALS — BP 104/68 | Ht 64.0 in | Wt 130.0 lb

## 2022-03-22 DIAGNOSIS — M76821 Posterior tibial tendinitis, right leg: Secondary | ICD-10-CM | POA: Insufficient documentation

## 2022-03-22 NOTE — Patient Instructions (Signed)
You have posterior tibialis tendinitis. Arch support is important - try your old orthotics but if these aren't comfortable a new pair of superfeet, spencos, Hapad comforthotics. You shouldn't need custom orthotics though. Do home exercises daily. Icing 15 minutes at a time when your kids will let you (3-4 times a day if possible). Ibuprofen '600mg'$  three times a day with food - consider taking for 7 days then as needed. Follow up with me in 1 month or as needed if you're doing well.

## 2022-03-22 NOTE — Progress Notes (Signed)
   New Patient Office Visit Subjective   Patient ID: Michelle Lang, female    DOB: May 08, 1985  Age: 37 y.o. MRN: [id number]  CC: Right foot/ankle pain   HPI: Michelle Lang is a 37 year old female with seronegative rheumatoid arthritis on Humira who presents with 3 weeks of right foot/ankle pain and swelling. She denies trauma but thinks that a long walk around the [place] in [place] may have been the inciting event. The pain is a dull ache that begins at her midfoot on the plantar surface and radiates upward medially. She experienced something similar about 1 month ago that improved with 3 days of rest. She has been icing and taking Ibuprofen which provides pain relief, allowing her to walk around at home. She enjoys walking and running but has been unable to do so due to the pain.     Objective:     BP 104/68   Ht '5\' 4"'$  (1.626 m)   Wt 130 lb (59 kg)   BMI 22.31 kg/m  Vital signs reviewed.   Physical Exam  Right foot: Normal gait. Swelling present medially distal to medial malleolus, no redness, increased warmth, or bruising. Nontender to palpation of medial malleolus. Mild tenderness to palpation distal to medial malleolus. Pes planus deformity present bilaterally. Full ROM of ankle. Strength 5/5 with resisted plantar/dorsiflexion, inversion, and eversion all causing mild discomfort. Negative talar tilt test.    Imaging Bedside US showed small area of fluid surrounding posterior tibialis tendon. Flexor hallucis longus, flexor digitorum tendons appear normal.   Assessment & Plan:   Problem List Items Addressed This Visit       Musculoskeletal and Integument   Tibialis posterior tendonitis, right - Primary    Patient with pes planus and 3 weeks of medial foot/ankle discomfort and swelling most consistent with tibialis posterior tendonitis. Small area of fluid surrounding posterior tibialis tendon visualized on ultrasound as well. Patient given rehab exercises and instructed  to continue icing and Ibuprofen. Also encouraged her to start using her orthotics again to support her arches. Okay to resume running as long as pain is 2/10 or less.      She also noted creakiness/pain in her knees that has improved since stopping running - given VMO and hip abductor strengthening exercises. Follow-up as needed.   Sabino Dick, MS4 [place] Lobo

## 2022-03-22 NOTE — Assessment & Plan Note (Addendum)
Patient with pes planus and 3 weeks of medial foot/ankle discomfort and swelling most consistent with tibialis posterior tendonitis. Small area of fluid surrounding posterior tibialis tendon visualized on ultrasound as well. Patient given rehab exercises and instructed to continue icing and Ibuprofen. Also encouraged her to start using her orthotics again to support her arches. Okay to resume running as long as pain is 2/10 or less. She also noted creakiness/pain in her knees that has improved since stopping running - given VMO and hip abductor strengthening exercises. Follow-up as needed.

## 2022-03-23 ENCOUNTER — Encounter: Payer: Self-pay | Admitting: Family Medicine

## 2022-03-24 ENCOUNTER — Other Ambulatory Visit (HOSPITAL_COMMUNITY): Payer: Self-pay

## 2022-03-25 ENCOUNTER — Other Ambulatory Visit (HOSPITAL_COMMUNITY): Payer: Self-pay

## 2022-03-26 ENCOUNTER — Other Ambulatory Visit: Payer: Self-pay

## 2022-03-29 ENCOUNTER — Other Ambulatory Visit (HOSPITAL_COMMUNITY): Payer: Self-pay

## 2022-03-30 ENCOUNTER — Other Ambulatory Visit (HOSPITAL_COMMUNITY): Payer: Self-pay

## 2022-04-23 ENCOUNTER — Other Ambulatory Visit (HOSPITAL_COMMUNITY): Payer: Self-pay

## 2022-04-28 ENCOUNTER — Other Ambulatory Visit (HOSPITAL_COMMUNITY): Payer: Self-pay

## 2022-05-18 ENCOUNTER — Other Ambulatory Visit (HOSPITAL_COMMUNITY): Payer: Self-pay

## 2022-05-20 ENCOUNTER — Other Ambulatory Visit (HOSPITAL_COMMUNITY): Payer: Self-pay

## 2022-05-24 ENCOUNTER — Other Ambulatory Visit (HOSPITAL_COMMUNITY): Payer: Self-pay

## 2022-05-26 ENCOUNTER — Other Ambulatory Visit: Payer: Self-pay

## 2022-05-26 ENCOUNTER — Other Ambulatory Visit (HOSPITAL_COMMUNITY): Payer: Self-pay

## 2022-06-15 ENCOUNTER — Other Ambulatory Visit (HOSPITAL_COMMUNITY): Payer: Self-pay

## 2022-06-17 ENCOUNTER — Other Ambulatory Visit (HOSPITAL_COMMUNITY): Payer: Self-pay

## 2022-06-22 ENCOUNTER — Other Ambulatory Visit (HOSPITAL_COMMUNITY): Payer: Self-pay

## 2022-06-24 ENCOUNTER — Other Ambulatory Visit (HOSPITAL_COMMUNITY): Payer: Self-pay

## 2022-06-29 ENCOUNTER — Other Ambulatory Visit (HOSPITAL_COMMUNITY): Payer: Self-pay

## 2022-07-01 ENCOUNTER — Other Ambulatory Visit (HOSPITAL_COMMUNITY): Payer: Self-pay

## 2022-07-05 DIAGNOSIS — Z6823 Body mass index (BMI) 23.0-23.9, adult: Secondary | ICD-10-CM | POA: Diagnosis not present

## 2022-07-05 DIAGNOSIS — M0609 Rheumatoid arthritis without rheumatoid factor, multiple sites: Secondary | ICD-10-CM | POA: Diagnosis not present

## 2022-07-07 ENCOUNTER — Ambulatory Visit: Payer: 59 | Attending: Internal Medicine | Admitting: Pharmacist

## 2022-07-07 DIAGNOSIS — Z79899 Other long term (current) drug therapy: Secondary | ICD-10-CM

## 2022-07-07 NOTE — Progress Notes (Signed)
  S: Patient presents for review of their specialty medication therapy.  Patient is currently taking Humira for RA. Patient is managed by Dr. Dierdre Forth for this.   Adherence: confirms  Efficacy: reports that the medication continues to work well for her.   Dosing:  Rheumatoid arthritis: SubQ: 40 mg every other week (may continue methotrexate, other nonbiologic DMARDS, corticosteroids, NSAIDs, and/or analgesics); patients not taking concomitant methotrexate may increase dose to 40 mg every week  Dose adjustments: Renal: no dose adjustments (has not been studied) Hepatic: no dose adjustments (has not been studied)  Drug-drug interactions: none identified   Screening: TB test: completed  Hepatitis: completed   Monitoring: S/sx of infection: none CBC: S/sx of hypersensitivity: none S/sx of malignancy: none S/sx of heart failure: none  Other side effects: none  O:   Lab Results  Component Value Date   WBC 15.5 (H) 04/13/2020   HGB 8.8 (L) 04/13/2020   HCT 26.2 (L) 04/13/2020   MCV 96.3 04/13/2020   PLT 152 04/13/2020      Chemistry      Component Value Date/Time   NA 137 09/07/2018 1341   K 4.3 09/07/2018 1341   CL 103 09/07/2018 1341   CO2 27 09/07/2018 1341   BUN 16 09/07/2018 1341   CREATININE 0.76 09/07/2018 1341      Component Value Date/Time   CALCIUM 9.2 09/07/2018 1341   ALKPHOS 61 09/07/2018 1341   AST 15 09/07/2018 1341   ALT 10 09/07/2018 1341   BILITOT 0.6 09/07/2018 1341       A/P: 1. Medication review: Patient currently on Humira for RA. Reviewed the medication with the patient, including the following: Humira is a TNF blocking agent indicated for ankylosing spondylitis, Crohn's disease, Hidradenitis suppurativa, psoriatic arthritis, plaque psoriasis, ulcerative colitis, and uveitis. Patient educated on purpose, proper use and potential adverse effects of Humira. Possible adverse effects are increased risk of infections, headache, and injection  site reactions. There is the possibility of an increased risk of malignancy but it is not well understood if this increased risk is due to there medication or the disease state. There are rare cases of pancytopenia and aplastic anemia. For SubQ injection at separate sites in the thigh or lower abdomen (avoiding areas within 2 inches of navel); rotate injection sites. May leave at room temperature for ~15 to 30 minutes prior to use; do not remove cap or cover while allowing product to reach room temperature. Do not use if solution is discolored or contains particulate matter. Do not administer to skin which is red, tender, bruised, hard, or that has scars, stretch marks, or psoriasis plaques. Needle cap of the prefilled syringe or needle cover for the adalimumab pen may contain latex. Prefilled pens and syringes are available for use by patients and the full amount of the syringe should be injected (self-administration); the vial is intended for institutional use only. Vials do not contain a preservative; discard unused portion. No recommendations for any changes at this time.   Butch Penny, PharmD, Patsy [place], CPP Clinical Pharmacist [facility] [phone]

## 2022-08-02 ENCOUNTER — Other Ambulatory Visit: Payer: Self-pay

## 2022-08-02 DIAGNOSIS — Z113 Encounter for screening for infections with a predominantly sexual mode of transmission: Secondary | ICD-10-CM | POA: Diagnosis not present

## 2022-08-02 DIAGNOSIS — Z01419 Encounter for gynecological examination (general) (routine) without abnormal findings: Secondary | ICD-10-CM | POA: Diagnosis not present

## 2022-08-02 DIAGNOSIS — F4322 Adjustment disorder with anxiety: Secondary | ICD-10-CM | POA: Diagnosis not present

## 2022-08-02 DIAGNOSIS — Z01411 Encounter for gynecological examination (general) (routine) with abnormal findings: Secondary | ICD-10-CM | POA: Diagnosis not present

## 2022-08-02 DIAGNOSIS — L659 Nonscarring hair loss, unspecified: Secondary | ICD-10-CM | POA: Diagnosis not present

## 2022-08-02 DIAGNOSIS — Z1329 Encounter for screening for other suspected endocrine disorder: Secondary | ICD-10-CM | POA: Diagnosis not present

## 2022-08-02 DIAGNOSIS — Z124 Encounter for screening for malignant neoplasm of cervix: Secondary | ICD-10-CM | POA: Diagnosis not present

## 2022-08-02 DIAGNOSIS — Z13220 Encounter for screening for lipoid disorders: Secondary | ICD-10-CM | POA: Diagnosis not present

## 2022-08-04 ENCOUNTER — Other Ambulatory Visit: Payer: Self-pay

## 2022-08-06 ENCOUNTER — Other Ambulatory Visit: Payer: Self-pay

## 2022-08-09 DIAGNOSIS — F4322 Adjustment disorder with anxiety: Secondary | ICD-10-CM | POA: Diagnosis not present

## 2022-08-13 DIAGNOSIS — N926 Irregular menstruation, unspecified: Secondary | ICD-10-CM | POA: Diagnosis not present

## 2022-08-13 DIAGNOSIS — N83202 Unspecified ovarian cyst, left side: Secondary | ICD-10-CM | POA: Diagnosis not present

## 2022-08-13 DIAGNOSIS — R1032 Left lower quadrant pain: Secondary | ICD-10-CM | POA: Diagnosis not present

## 2022-08-13 DIAGNOSIS — N939 Abnormal uterine and vaginal bleeding, unspecified: Secondary | ICD-10-CM | POA: Diagnosis not present

## 2022-08-25 ENCOUNTER — Other Ambulatory Visit (HOSPITAL_COMMUNITY): Payer: Self-pay

## 2022-08-26 ENCOUNTER — Other Ambulatory Visit (HOSPITAL_COMMUNITY): Payer: Self-pay

## 2022-08-27 ENCOUNTER — Other Ambulatory Visit: Payer: Self-pay

## 2022-08-27 ENCOUNTER — Other Ambulatory Visit (HOSPITAL_COMMUNITY): Payer: Self-pay

## 2022-08-27 ENCOUNTER — Other Ambulatory Visit: Payer: Self-pay | Admitting: Pharmacist

## 2022-08-27 MED ORDER — HUMIRA (2 PEN) 40 MG/0.4ML ~~LOC~~ AJKT
40.0000 mg | AUTO-INJECTOR | SUBCUTANEOUS | 5 refills | Status: DC
  Filled 2022-08-27 (×2): qty 2, 28d supply, fill #0
  Filled 2022-10-05: qty 2, 28d supply, fill #1
  Filled 2022-11-09: qty 2, 28d supply, fill #2
  Filled 2022-12-10: qty 2, 28d supply, fill #3
  Filled 2023-02-21: qty 2, 28d supply, fill #4
  Filled 2023-03-22: qty 2, 28d supply, fill #5

## 2022-08-27 MED ORDER — HUMIRA (2 PEN) 40 MG/0.4ML ~~LOC~~ AJKT
40.0000 mg | AUTO-INJECTOR | SUBCUTANEOUS | 5 refills | Status: DC
  Filled 2022-08-27: qty 2, 14d supply, fill #0

## 2022-08-31 ENCOUNTER — Other Ambulatory Visit (HOSPITAL_COMMUNITY): Payer: Self-pay

## 2022-09-21 ENCOUNTER — Other Ambulatory Visit (HOSPITAL_COMMUNITY): Payer: Self-pay

## 2022-09-22 DIAGNOSIS — D2239 Melanocytic nevi of other parts of face: Secondary | ICD-10-CM | POA: Diagnosis not present

## 2022-09-22 DIAGNOSIS — L578 Other skin changes due to chronic exposure to nonionizing radiation: Secondary | ICD-10-CM | POA: Diagnosis not present

## 2022-09-23 ENCOUNTER — Other Ambulatory Visit (HOSPITAL_COMMUNITY): Payer: Self-pay

## 2022-09-27 ENCOUNTER — Encounter (HOSPITAL_COMMUNITY): Payer: Self-pay

## 2022-09-27 ENCOUNTER — Other Ambulatory Visit (HOSPITAL_COMMUNITY): Payer: Self-pay

## 2022-09-28 ENCOUNTER — Other Ambulatory Visit: Payer: Self-pay

## 2022-09-29 ENCOUNTER — Telehealth: Payer: 59 | Admitting: Nurse Practitioner

## 2022-09-29 ENCOUNTER — Other Ambulatory Visit (HOSPITAL_COMMUNITY): Payer: Self-pay

## 2022-09-29 DIAGNOSIS — J0110 Acute frontal sinusitis, unspecified: Secondary | ICD-10-CM | POA: Diagnosis not present

## 2022-09-29 MED ORDER — FLUTICASONE PROPIONATE 50 MCG/ACT NA SUSP
2.0000 | Freq: Every day | NASAL | 6 refills | Status: AC
Start: 2022-09-29 — End: ?
  Filled 2022-09-29: qty 16, 30d supply, fill #0

## 2022-09-29 MED ORDER — AMOXICILLIN-POT CLAVULANATE 875-125 MG PO TABS
1.0000 | ORAL_TABLET | Freq: Two times a day (BID) | ORAL | 0 refills | Status: AC
  Filled 2022-09-29: qty 14, 7d supply, fill #0

## 2022-09-29 NOTE — Progress Notes (Signed)
Virtual Visit Consent   Michelle Lang, you are scheduled for a virtual visit with a [facility] provider today. Just as with appointments in the office, your consent must be obtained to participate. Your consent will be active for this visit and any virtual visit you may have with one of our providers in the next 365 days. If you have a MyChart account, a copy of this consent can be sent to you electronically.  As this is a virtual visit, video technology does not allow for your provider to perform a traditional examination. This may limit your provider's ability to fully assess your condition. If your provider identifies any concerns that need to be evaluated in person or the need to arrange testing (such as labs, EKG, etc.), we will make arrangements to do so. Although advances in technology are sophisticated, we cannot ensure that it will always work on either your end or our end. If the connection with a video visit is poor, the visit may have to be switched to a telephone visit. With either a video or telephone visit, we are not always able to ensure that we have a secure connection.  By engaging in this virtual visit, you consent to the provision of healthcare and authorize for your insurance to be billed (if applicable) for the services provided during this visit. Depending on your insurance coverage, you may receive a charge related to this service.  I need to obtain your verbal consent now. Are you willing to proceed with your visit today? Michelle Lang has provided verbal consent on 09/29/2022 for a virtual visit (video or telephone). Viviano Simas, FNP  Date: 09/29/2022 1:49 PM  Virtual Visit via Video Note   I, Viviano Simas, connected with  Michelle Lang  ([id number], [birth year]) on 09/29/22 at  2:00 PM EDT by a video-enabled telemedicine application and verified that I am speaking with the correct person using two identifiers.  Location: Patient: Virtual  Visit Location Patient: Home Provider: Virtual Visit Location Provider: Home Office   I discussed the limitations of evaluation and management by telemedicine and the availability of in person appointments. The patient expressed understanding and agreed to proceed.    History of Present Illness: Michelle Lang is a 37 y.o. who identifies as a female who was assigned female at birth, and is being seen today for complaints of sinus congestion that started 8 days ago  Started with fever, nasal congestion and she feels that today when she bends forward she has increased pressure and pain in her forehead   This feels similar to when she has had a sinus infection in the past   She is currently on Humira   She has been using ibuprofen and tylenol  She has also used Nyquil for relief  She does also take Zyrtec regularly    Problems:  Patient Active Problem List   Diagnosis Date Noted   Tibialis posterior tendonitis, right 03/22/2022   Maternal anemia, with delivery 04/13/2020   VBAC (vaginal birth after Cesarean) 2/12 04/12/2020   Postpartum care following vaginal delivery 2/12 04/12/2020   Second degree perineal laceration 04/12/2020   Encounter for induction of labor 04/11/2020   History of cesarean section, low transverse 04/11/2020    Allergies:  Allergies  Allergen Reactions   Other Other (See Comments)    Honeycomb bandage caused blistering    Medications:  Current Outpatient Medications:    adalimumab (HUMIRA, 2 PEN,) 40 MG/0.4ML pen,  Inject 0.4 mLs (40 mg total) into the skin every 14 (fourteen) days., Disp: 2 each, Rfl: 5   amoxicillin-clavulanate (AUGMENTIN) 875-125 MG tablet, Take 1 tablet by mouth 2 (two) times daily. (Patient not taking: Reported on 03/22/2022), Disp: 20 tablet, Rfl: 0   cetirizine (ZYRTEC ALLERGY) 10 MG tablet, 1 tablet Orally Once a day, Disp: , Rfl:    COVID-19 mRNA vaccine 2023-2024 (COMIRNATY) syringe, Inject into the muscle. (Patient not  taking: Reported on 03/22/2022), Disp: 0.3 mL, Rfl: 0   ibuprofen (ADVIL) 600 MG tablet, Take 1 tablet (600 mg total) by mouth every 6 (six) hours. (Patient not taking: Reported on 03/22/2022), Disp: 30 tablet, Rfl: 0   influenza vac split quadrivalent PF (FLUARIX QUADRIVALENT) 0.5 ML injection, Inject into the muscle. (Patient not taking: Reported on 03/22/2022), Disp: 0.5 mL, Rfl: 0  Observations/Objective: Patient is well-developed, well-nourished in no acute distress.  Resting comfortably  at home.  Head is normocephalic, atraumatic.  No labored breathing.  Speech is clear and coherent with logical content.  Patient is alert and oriented at baseline.    Assessment and Plan:  1. Acute non-recurrent frontal sinusitis  - amoxicillin-clavulanate (AUGMENTIN) 875-125 MG tablet; Take 1 tablet by mouth 2 (two) times daily for 7 days. Take with food  Dispense: 14 tablet; Refill: 0  - fluticasone (FLONASE) 50 MCG/ACT nasal spray; Place 2 sprays into both nostrils daily.  Dispense: 16 g; Refill: 6     Follow Up Instructions: I discussed the assessment and treatment plan with the patient. The patient was provided an opportunity to ask questions and all were answered. The patient agreed with the plan and demonstrated an understanding of the instructions.  A copy of instructions were sent to the patient via MyChart unless otherwise noted below.    The patient was advised to call back or seek an in-person evaluation if the symptoms worsen or if the condition fails to improve as anticipated.  Time:  I spent 15 minutes with the patient via telehealth technology discussing the above problems/concerns.    Viviano Simas, FNP

## 2022-10-04 ENCOUNTER — Other Ambulatory Visit: Payer: Self-pay

## 2022-10-05 ENCOUNTER — Other Ambulatory Visit (HOSPITAL_COMMUNITY): Payer: Self-pay

## 2022-10-05 ENCOUNTER — Other Ambulatory Visit: Payer: Self-pay

## 2022-10-08 ENCOUNTER — Other Ambulatory Visit (HOSPITAL_COMMUNITY): Payer: Self-pay

## 2022-10-08 ENCOUNTER — Encounter (HOSPITAL_COMMUNITY): Payer: Self-pay

## 2022-10-11 ENCOUNTER — Other Ambulatory Visit (HOSPITAL_COMMUNITY): Payer: Self-pay

## 2022-10-12 ENCOUNTER — Other Ambulatory Visit: Payer: Self-pay

## 2022-10-12 ENCOUNTER — Other Ambulatory Visit (HOSPITAL_COMMUNITY): Payer: Self-pay

## 2022-10-13 ENCOUNTER — Other Ambulatory Visit (HOSPITAL_COMMUNITY): Payer: Self-pay

## 2022-10-13 ENCOUNTER — Encounter (HOSPITAL_COMMUNITY): Payer: Self-pay

## 2022-10-14 ENCOUNTER — Other Ambulatory Visit: Payer: Self-pay

## 2022-10-15 ENCOUNTER — Other Ambulatory Visit (HOSPITAL_COMMUNITY): Payer: Self-pay

## 2022-10-15 ENCOUNTER — Other Ambulatory Visit: Payer: Self-pay

## 2022-10-18 ENCOUNTER — Other Ambulatory Visit (HOSPITAL_COMMUNITY): Payer: Self-pay

## 2022-10-19 ENCOUNTER — Other Ambulatory Visit (HOSPITAL_COMMUNITY): Payer: Self-pay

## 2022-11-05 ENCOUNTER — Other Ambulatory Visit (HOSPITAL_COMMUNITY): Payer: Self-pay

## 2022-11-08 ENCOUNTER — Encounter (HOSPITAL_COMMUNITY): Payer: Self-pay

## 2022-11-08 ENCOUNTER — Other Ambulatory Visit (HOSPITAL_COMMUNITY): Payer: Self-pay

## 2022-11-09 ENCOUNTER — Other Ambulatory Visit (HOSPITAL_COMMUNITY): Payer: Self-pay

## 2022-11-09 MED ORDER — INFLUENZA VIRUS VACC SPLIT PF (FLUZONE) 0.5 ML IM SUSY
0.5000 mL | PREFILLED_SYRINGE | Freq: Once | INTRAMUSCULAR | 0 refills | Status: AC
  Filled 2022-11-09: qty 0.5, 1d supply, fill #0

## 2022-11-09 MED ORDER — COVID-19 MRNA VAC-TRIS(PFIZER) 30 MCG/0.3ML IM SUSY
0.3000 mL | PREFILLED_SYRINGE | Freq: Once | INTRAMUSCULAR | 0 refills | Status: AC
  Filled 2022-11-09: qty 0.3, 1d supply, fill #0

## 2022-11-10 ENCOUNTER — Other Ambulatory Visit (HOSPITAL_COMMUNITY): Payer: Self-pay

## 2022-11-10 ENCOUNTER — Other Ambulatory Visit: Payer: Self-pay

## 2022-11-15 ENCOUNTER — Other Ambulatory Visit (HOSPITAL_COMMUNITY): Payer: Self-pay

## 2022-11-20 ENCOUNTER — Encounter (HOSPITAL_COMMUNITY): Payer: Self-pay

## 2022-11-25 ENCOUNTER — Other Ambulatory Visit: Payer: Self-pay

## 2022-12-02 ENCOUNTER — Other Ambulatory Visit: Payer: Self-pay

## 2022-12-06 ENCOUNTER — Other Ambulatory Visit (HOSPITAL_COMMUNITY): Payer: Self-pay

## 2022-12-08 ENCOUNTER — Other Ambulatory Visit: Payer: Self-pay

## 2022-12-10 ENCOUNTER — Other Ambulatory Visit: Payer: Self-pay

## 2022-12-10 NOTE — Progress Notes (Signed)
Specialty Pharmacy Refill Coordination Note  Maygen Sirico Carely Nappier is a 37 y.o. female contacted today regarding refills of specialty medication(s) Adalimumab   Patient requested Daryll Drown at [facility] at [place] date: 12/29/22   Medication will be filled on 12/28/22.

## 2023-01-18 ENCOUNTER — Other Ambulatory Visit (HOSPITAL_COMMUNITY): Payer: Self-pay

## 2023-01-19 ENCOUNTER — Other Ambulatory Visit: Payer: Self-pay

## 2023-01-19 ENCOUNTER — Encounter (HOSPITAL_COMMUNITY): Payer: Self-pay

## 2023-01-19 NOTE — Progress Notes (Signed)
Specialty Pharmacy Ongoing Clinical Assessment Note  Michelle Lang is a 37 y.o. female who is being followed by the specialty pharmacy service for RxSp Rheumatoid Arthritis   Patient's specialty medication(s) reviewed today: Adalimumab   Missed doses in the last 4 weeks: 1-2   Patient/Caregiver did not have any additional questions or concerns.   Therapeutic benefit summary: Patient is achieving benefit   Adverse events/side effects summary: No adverse events/side effects   Patient's therapy is appropriate to: Continue    Goals Addressed             This Visit's Progress    Reduce signs and symptoms       Patient is on track. Patient will maintain adherence         Follow up:  6 months  Otto Herb Specialty Pharmacist

## 2023-01-20 ENCOUNTER — Other Ambulatory Visit (HOSPITAL_COMMUNITY): Payer: Self-pay

## 2023-01-26 ENCOUNTER — Other Ambulatory Visit (HOSPITAL_COMMUNITY): Payer: Self-pay

## 2023-01-26 DIAGNOSIS — Z6823 Body mass index (BMI) 23.0-23.9, adult: Secondary | ICD-10-CM | POA: Diagnosis not present

## 2023-01-26 DIAGNOSIS — M25562 Pain in left knee: Secondary | ICD-10-CM | POA: Diagnosis not present

## 2023-01-26 DIAGNOSIS — M0609 Rheumatoid arthritis without rheumatoid factor, multiple sites: Secondary | ICD-10-CM | POA: Diagnosis not present

## 2023-01-26 MED ORDER — PREDNISONE 5 MG (48) PO TBPK
ORAL_TABLET | ORAL | 0 refills | Status: DC
  Filled 2023-01-26: qty 48, 12d supply, fill #0

## 2023-02-21 ENCOUNTER — Other Ambulatory Visit: Payer: Self-pay

## 2023-02-21 NOTE — Progress Notes (Signed)
Specialty Pharmacy Refill Coordination Note  Michelle Lang Michelle Lang is a 37 y.o. female contacted today regarding refills of specialty medication(s) Adalimumab (Humira (2 Pen))   Patient requested Pickup at [facility] at [facility] date: 02/28/23   Medication will be filled on 02/25/23.

## 2023-02-25 ENCOUNTER — Other Ambulatory Visit: Payer: Self-pay

## 2023-02-28 ENCOUNTER — Other Ambulatory Visit (HOSPITAL_COMMUNITY): Payer: Self-pay

## 2023-03-22 ENCOUNTER — Other Ambulatory Visit: Payer: Self-pay

## 2023-03-22 NOTE — Progress Notes (Signed)
Specialty Pharmacy Refill Coordination Note  Michelle Lang Michelle Lang is a 38 y.o. female contacted today regarding refills of specialty medication(s) Adalimumab (Humira (2 Pen))   Patient requested Pickup at [facility] at [facility] date: 03/29/23   Medication will be filled on 03/28/23.

## 2023-03-28 ENCOUNTER — Other Ambulatory Visit: Payer: Self-pay

## 2023-03-29 DIAGNOSIS — M0609 Rheumatoid arthritis without rheumatoid factor, multiple sites: Secondary | ICD-10-CM | POA: Diagnosis not present

## 2023-03-29 DIAGNOSIS — M25562 Pain in left knee: Secondary | ICD-10-CM | POA: Diagnosis not present

## 2023-03-29 DIAGNOSIS — Z6823 Body mass index (BMI) 23.0-23.9, adult: Secondary | ICD-10-CM | POA: Diagnosis not present

## 2023-04-18 ENCOUNTER — Other Ambulatory Visit: Payer: Self-pay

## 2023-04-22 ENCOUNTER — Other Ambulatory Visit: Payer: Self-pay

## 2023-04-25 ENCOUNTER — Other Ambulatory Visit: Payer: Self-pay

## 2023-04-25 NOTE — Progress Notes (Signed)
 Specialty Pharmacy Refill Coordination Note  Michelle Lang is a 38 y.o. female contacted today regarding refills of specialty medication(s) Adalimumab (Humira (2 Pen))   Patient requested Pickup at [facility] at [place] date: 04/27/23   Medication will be filled on 04/26/23, pending refill approval.   This fill date is pending response to refill request from provider. Patient is aware and if they have not received fill by intended date they must follow up with pharmacy.     Send to [facility] for rewrite.

## 2023-04-27 ENCOUNTER — Other Ambulatory Visit (HOSPITAL_COMMUNITY): Payer: Self-pay

## 2023-04-28 ENCOUNTER — Other Ambulatory Visit: Payer: Self-pay

## 2023-04-29 ENCOUNTER — Other Ambulatory Visit: Payer: Self-pay

## 2023-05-02 ENCOUNTER — Other Ambulatory Visit (HOSPITAL_COMMUNITY): Payer: Self-pay

## 2023-05-02 ENCOUNTER — Other Ambulatory Visit: Payer: Self-pay | Admitting: Pharmacist

## 2023-05-02 ENCOUNTER — Other Ambulatory Visit: Payer: Self-pay

## 2023-05-02 MED ORDER — HUMIRA (2 PEN) 40 MG/0.4ML ~~LOC~~ AJKT: AUTO-INJECTOR | SUBCUTANEOUS | 5 refills | Status: DC

## 2023-05-02 MED ORDER — HUMIRA (2 PEN) 40 MG/0.4ML ~~LOC~~ AJKT
AUTO-INJECTOR | SUBCUTANEOUS | 5 refills | Status: DC
  Filled 2023-05-02: qty 2, 28d supply, fill #0
  Filled 2023-05-20: qty 2, 28d supply, fill #1
  Filled 2023-06-17: qty 2, 28d supply, fill #2
  Filled 2023-07-18: qty 2, 28d supply, fill #3
  Filled 2023-08-15: qty 2, 28d supply, fill #4
  Filled 2023-09-12 – 2023-09-14 (×2): qty 2, 28d supply, fill #5

## 2023-05-02 NOTE — Progress Notes (Signed)
 Elizabeth called from Dr. Nat Math office and gave a verbal authorization for Humira.

## 2023-05-03 ENCOUNTER — Other Ambulatory Visit: Payer: Self-pay

## 2023-05-03 ENCOUNTER — Other Ambulatory Visit (HOSPITAL_COMMUNITY): Payer: Self-pay

## 2023-05-04 DIAGNOSIS — H5213 Myopia, bilateral: Secondary | ICD-10-CM | POA: Diagnosis not present

## 2023-05-13 ENCOUNTER — Other Ambulatory Visit: Payer: Self-pay

## 2023-05-20 ENCOUNTER — Other Ambulatory Visit: Payer: Self-pay

## 2023-05-20 NOTE — Progress Notes (Signed)
 Specialty Pharmacy Refill Coordination Note  Michelle Lang is a 38 y.o. female contacted today regarding refills of specialty medication(s) Adalimumab (Humira (2 Pen))   Patient requested Pickup at [facility] at [facility] date: 05/24/23   Medication will be filled on 05/23/23.

## 2023-06-03 DIAGNOSIS — N6310 Unspecified lump in the right breast, unspecified quadrant: Secondary | ICD-10-CM | POA: Diagnosis not present

## 2023-06-06 ENCOUNTER — Other Ambulatory Visit: Payer: Self-pay | Admitting: Obstetrics and Gynecology

## 2023-06-06 ENCOUNTER — Ambulatory Visit
Admission: RE | Admit: 2023-06-06 | Discharge: 2023-06-06 | Disposition: A | Discharge status: Home / Self Care | Source: Ambulatory Visit | Attending: Obstetrics and Gynecology | Admitting: Obstetrics and Gynecology

## 2023-06-06 DIAGNOSIS — N6313 Unspecified lump in the right breast, lower outer quadrant: Secondary | ICD-10-CM | POA: Diagnosis not present

## 2023-06-06 DIAGNOSIS — N63 Unspecified lump in unspecified breast: Secondary | ICD-10-CM

## 2023-06-06 DIAGNOSIS — N6011 Diffuse cystic mastopathy of right breast: Secondary | ICD-10-CM | POA: Diagnosis not present

## 2023-06-14 ENCOUNTER — Other Ambulatory Visit: Payer: Self-pay

## 2023-06-17 ENCOUNTER — Other Ambulatory Visit: Payer: Self-pay

## 2023-06-17 NOTE — Progress Notes (Signed)
 Specialty Pharmacy Refill Coordination Note  Michelle Lang [name] is a 38 y.o. female contacted today regarding refills of specialty medication(s) Adalimumab  (Humira  (2 Pen))   Patient requested [name] at [facility] at [place] date: 06/20/23   Medication will be filled on 06/17/23.

## 2023-07-11 ENCOUNTER — Other Ambulatory Visit: Payer: Self-pay

## 2023-07-14 ENCOUNTER — Other Ambulatory Visit (HOSPITAL_COMMUNITY): Payer: Self-pay

## 2023-07-18 ENCOUNTER — Other Ambulatory Visit: Payer: Self-pay | Admitting: Pharmacy Technician

## 2023-07-18 ENCOUNTER — Other Ambulatory Visit: Payer: Self-pay

## 2023-07-18 NOTE — Progress Notes (Signed)
 Specialty Pharmacy Ongoing Clinical Assessment Note  Michelle Lang is a 38 y.o. female who is being followed by the specialty pharmacy service for RxSp Rheumatoid Arthritis   Patient's specialty medication(s) reviewed today: Adalimumab  (Humira  (2 Pen))   Missed doses in the last 4 weeks: 0   Patient/Caregiver did not have any additional questions or concerns.   Therapeutic benefit summary: Patient is achieving benefit   Adverse events/side effects summary: No adverse events/side effects   Patient's therapy is appropriate to: Continue    Goals Addressed             This Visit's Progress    Reduce signs and symptoms   On track    Patient is on track. Patient will maintain adherence         Follow up: 6 months  Nikolas Casher M Darious Rehman Specialty Pharmacist

## 2023-07-18 NOTE — Progress Notes (Signed)
 Specialty Pharmacy Refill Coordination Note  Michelle Lang is a 38 y.o. female contacted today regarding refills of specialty medication(s) Adalimumab  (Humira  (2 Pen))   Patient requested Pickup at [facility] at [place] date: 07/19/23   Medication will be filled on 07/18/23.

## 2023-07-28 ENCOUNTER — Ambulatory Visit: Attending: Internal Medicine | Admitting: Pharmacist

## 2023-07-28 ENCOUNTER — Encounter: Payer: Self-pay | Admitting: Pharmacist

## 2023-07-28 DIAGNOSIS — Z79899 Other long term (current) drug therapy: Secondary | ICD-10-CM

## 2023-07-28 NOTE — Progress Notes (Signed)
  S: Patient presents for review of their specialty medication therapy.  Patient is currently taking Humira for RA. Patient is managed by Dr. Dierdre Forth for this.   Adherence: confirms  Efficacy: reports that the medication continues to work well for her.   Dosing:  Rheumatoid arthritis: SubQ: 40 mg every other week (may continue methotrexate, other nonbiologic DMARDS, corticosteroids, NSAIDs, and/or analgesics); patients not taking concomitant methotrexate may increase dose to 40 mg every week  Dose adjustments: Renal: no dose adjustments (has not been studied) Hepatic: no dose adjustments (has not been studied)  Drug-drug interactions: none identified   Screening: TB test: completed  Hepatitis: completed   Monitoring: S/sx of infection: none CBC: S/sx of hypersensitivity: none S/sx of malignancy: none S/sx of heart failure: none  Other side effects: none  O:   Lab Results  Component Value Date   WBC 15.5 (H) 04/13/2020   HGB 8.8 (L) 04/13/2020   HCT 26.2 (L) 04/13/2020   MCV 96.3 04/13/2020   PLT 152 04/13/2020      Chemistry      Component Value Date/Time   NA 137 09/07/2018 1341   K 4.3 09/07/2018 1341   CL 103 09/07/2018 1341   CO2 27 09/07/2018 1341   BUN 16 09/07/2018 1341   CREATININE 0.76 09/07/2018 1341      Component Value Date/Time   CALCIUM 9.2 09/07/2018 1341   ALKPHOS 61 09/07/2018 1341   AST 15 09/07/2018 1341   ALT 10 09/07/2018 1341   BILITOT 0.6 09/07/2018 1341       A/P: 1. Medication review: Patient currently on Humira for RA. Reviewed the medication with the patient, including the following: Humira is a TNF blocking agent indicated for ankylosing spondylitis, Crohn's disease, Hidradenitis suppurativa, psoriatic arthritis, plaque psoriasis, ulcerative colitis, and uveitis. Patient educated on purpose, proper use and potential adverse effects of Humira. Possible adverse effects are increased risk of infections, headache, and injection  site reactions. There is the possibility of an increased risk of malignancy but it is not well understood if this increased risk is due to there medication or the disease state. There are rare cases of pancytopenia and aplastic anemia. For SubQ injection at separate sites in the thigh or lower abdomen (avoiding areas within 2 inches of navel); rotate injection sites. May leave at room temperature for ~15 to 30 minutes prior to use; do not remove cap or cover while allowing product to reach room temperature. Do not use if solution is discolored or contains particulate matter. Do not administer to skin which is red, tender, bruised, hard, or that has scars, stretch marks, or psoriasis plaques. Needle cap of the prefilled syringe or needle cover for the adalimumab pen may contain latex. Prefilled pens and syringes are available for use by patients and the full amount of the syringe should be injected (self-administration); the vial is intended for institutional use only. Vials do not contain a preservative; discard unused portion. No recommendations for any changes at this time.   Butch Penny, PharmD, Patsy [place], CPP Clinical Pharmacist [facility] [phone]

## 2023-08-10 DIAGNOSIS — Z713 Dietary counseling and surveillance: Secondary | ICD-10-CM | POA: Diagnosis not present

## 2023-08-10 DIAGNOSIS — Z8249 Family history of ischemic heart disease and other diseases of the circulatory system: Secondary | ICD-10-CM | POA: Diagnosis not present

## 2023-08-11 ENCOUNTER — Other Ambulatory Visit: Payer: Self-pay

## 2023-08-15 ENCOUNTER — Other Ambulatory Visit: Payer: Self-pay | Admitting: Pharmacy Technician

## 2023-08-15 ENCOUNTER — Other Ambulatory Visit: Payer: Self-pay

## 2023-08-15 NOTE — Progress Notes (Signed)
 Specialty Pharmacy Refill Coordination Note  Junette Bernat Sherby Moncayo is a 38 y.o. female contacted today regarding refills of specialty medication(s) Adalimumab  (Humira  (2 Pen))   Patient requested Pickup at [facility] at [place] date: 08/22/23   Medication will be filled on 08/19/23.

## 2023-08-18 ENCOUNTER — Other Ambulatory Visit: Payer: Self-pay

## 2023-08-24 DIAGNOSIS — Z713 Dietary counseling and surveillance: Secondary | ICD-10-CM | POA: Diagnosis not present

## 2023-08-24 DIAGNOSIS — Z8249 Family history of ischemic heart disease and other diseases of the circulatory system: Secondary | ICD-10-CM | POA: Diagnosis not present

## 2023-09-12 ENCOUNTER — Other Ambulatory Visit: Payer: Self-pay

## 2023-09-14 ENCOUNTER — Other Ambulatory Visit: Payer: Self-pay

## 2023-09-14 ENCOUNTER — Other Ambulatory Visit: Payer: Self-pay | Admitting: Pharmacy Technician

## 2023-09-14 NOTE — Progress Notes (Signed)
 Specialty Pharmacy Refill Coordination Note  Michelle Lang Michelle Lang is a 38 y.o. female contacted today regarding refills of specialty medication(s) Adalimumab  (Humira  (2 Pen))   Patient requested Pickup at [facility] at [place] date: 09/15/23   Medication will be filled on 09/14/23.

## 2023-09-16 ENCOUNTER — Other Ambulatory Visit (HOSPITAL_COMMUNITY): Payer: Self-pay

## 2023-10-04 ENCOUNTER — Encounter (INDEPENDENT_AMBULATORY_CARE_PROVIDER_SITE_OTHER): Payer: Self-pay

## 2023-10-04 ENCOUNTER — Other Ambulatory Visit: Payer: Self-pay

## 2023-10-04 ENCOUNTER — Other Ambulatory Visit: Payer: Self-pay | Admitting: Pharmacy Technician

## 2023-10-04 NOTE — Progress Notes (Addendum)
 Specialty Pharmacy Refill Coordination Note  Michelle Lang is a 38 y.o. female contacted today regarding refills of specialty medication(s) Adalimumab  (Humira  (2 Pen))   Patient requested (Patient-Rptd) Pickup at [facility] at [facility] date: (Patient-Rptd) 10/17/23   Medication will be filled on 10/14/23.    RR sent to MD to [facility] then send to [facility] for Re-write

## 2023-10-06 ENCOUNTER — Other Ambulatory Visit (HOSPITAL_COMMUNITY): Payer: Self-pay

## 2023-10-07 ENCOUNTER — Other Ambulatory Visit (HOSPITAL_COMMUNITY): Payer: Self-pay

## 2023-10-17 ENCOUNTER — Other Ambulatory Visit: Payer: Self-pay

## 2023-10-17 DIAGNOSIS — M0609 Rheumatoid arthritis without rheumatoid factor, multiple sites: Secondary | ICD-10-CM | POA: Diagnosis not present

## 2023-10-18 ENCOUNTER — Other Ambulatory Visit: Payer: Self-pay

## 2023-10-18 ENCOUNTER — Other Ambulatory Visit: Payer: Self-pay | Admitting: Internal Medicine

## 2023-10-18 ENCOUNTER — Other Ambulatory Visit (HOSPITAL_COMMUNITY): Payer: Self-pay

## 2023-10-18 ENCOUNTER — Other Ambulatory Visit: Payer: Self-pay | Admitting: Pharmacist

## 2023-10-18 MED ORDER — HUMIRA (2 PEN) 40 MG/0.4ML ~~LOC~~ AJKT: AUTO-INJECTOR | SUBCUTANEOUS | 3 refills | Status: DC

## 2023-10-18 MED ORDER — HUMIRA (2 PEN) 40 MG/0.4ML ~~LOC~~ AJKT
AUTO-INJECTOR | SUBCUTANEOUS | 3 refills | Status: DC
  Filled 2023-10-19: qty 2, 28d supply, fill #0
  Filled 2023-11-11 – 2023-11-14 (×2): qty 2, 28d supply, fill #1
  Filled 2023-12-08 – 2023-12-12 (×2): qty 2, 28d supply, fill #2
  Filled 2024-01-03: qty 2, 28d supply, fill #3

## 2023-10-18 NOTE — Progress Notes (Signed)
 Patient called regarding medication refill. [facility] just sent RX in today and rewrite is required, Humira  will need to fill at central due to inventory and courier to [facility] tomorrow morning.

## 2023-10-19 ENCOUNTER — Other Ambulatory Visit: Payer: Self-pay

## 2023-10-19 ENCOUNTER — Other Ambulatory Visit (HOSPITAL_COMMUNITY): Payer: Self-pay

## 2023-10-19 NOTE — Progress Notes (Signed)
 Specialty Pharmacy Refill Coordination Note  Michelle Lang Audreyana Huntsberry is a 38 y.o. female contacted today regarding refills of specialty medication(s) Adalimumab  (Humira  (2 Pen))   Patient requested Pickup at [facility] at [place] date: 10/19/23   Medication will be filled on 10/19/23.

## 2023-10-20 ENCOUNTER — Other Ambulatory Visit: Payer: Self-pay

## 2023-11-09 ENCOUNTER — Other Ambulatory Visit (HOSPITAL_COMMUNITY): Payer: Self-pay

## 2023-11-11 ENCOUNTER — Telehealth: Payer: Self-pay

## 2023-11-11 ENCOUNTER — Other Ambulatory Visit: Payer: Self-pay

## 2023-11-11 NOTE — Telephone Encounter (Signed)
 Pharmacy Patient Advocate Encounter   Received notification from Patient Pharmacy that prior authorization for Humira  is required/requested.   Insurance verification completed.   The patient is insured through [facility] .   Per test claim: PA required; PA submitted to above mentioned insurance via Latent Key/confirmation #/EOC [id number] Status is pending

## 2023-11-14 ENCOUNTER — Other Ambulatory Visit: Payer: Self-pay

## 2023-11-14 NOTE — Progress Notes (Signed)
 Specialty Pharmacy Refill Coordination Note  Michelle Lang [name] is a 38 y.o. female contacted today regarding refills of specialty medication(s) Adalimumab  (Humira  (2 Pen))   Patient requested Pickup at [facility] at [place] date: 11/14/23   Medication will be filled on 11/13/23.

## 2023-11-15 ENCOUNTER — Other Ambulatory Visit: Payer: Self-pay

## 2023-11-15 NOTE — Telephone Encounter (Signed)
 Pharmacy Patient Advocate Encounter  Received notification from [facility] that Prior Authorization for Humira  has been APPROVED from 11/05/23 to 11/03/24   PA #/Case ID/Reference #: [id number]

## 2023-11-15 NOTE — Progress Notes (Signed)
 PA approved.

## 2023-11-16 DIAGNOSIS — Z6823 Body mass index (BMI) 23.0-23.9, adult: Secondary | ICD-10-CM | POA: Diagnosis not present

## 2023-11-16 DIAGNOSIS — M0609 Rheumatoid arthritis without rheumatoid factor, multiple sites: Secondary | ICD-10-CM | POA: Diagnosis not present

## 2023-12-06 ENCOUNTER — Other Ambulatory Visit (HOSPITAL_COMMUNITY): Payer: Self-pay

## 2023-12-08 ENCOUNTER — Other Ambulatory Visit: Payer: Self-pay

## 2023-12-12 ENCOUNTER — Other Ambulatory Visit: Payer: Self-pay

## 2023-12-12 NOTE — Progress Notes (Signed)
 Specialty Pharmacy Refill Coordination Note  Michelle Lang Michelle Lang is a 38 y.o. female contacted today regarding refills of specialty medication(s) Adalimumab  (Humira  (2 Pen))   Patient requested Pickup at [facility] at [place] date: 12/13/23   Medication will be filled on 12/12/23.

## 2023-12-30 ENCOUNTER — Ambulatory Visit: Admitting: Family Medicine

## 2023-12-30 ENCOUNTER — Encounter: Payer: Self-pay | Admitting: Family Medicine

## 2023-12-30 VITALS — BP 112/70 | HR 88 | Temp 97.8°F | Ht 64.0 in | Wt 135.2 lb

## 2023-12-30 DIAGNOSIS — R1031 Right lower quadrant pain: Secondary | ICD-10-CM | POA: Diagnosis not present

## 2023-12-30 DIAGNOSIS — Z23 Encounter for immunization: Secondary | ICD-10-CM

## 2023-12-30 DIAGNOSIS — M1380 Other specified arthritis, unspecified site: Secondary | ICD-10-CM

## 2023-12-30 DIAGNOSIS — R198 Other specified symptoms and signs involving the digestive system and abdomen: Secondary | ICD-10-CM

## 2023-12-30 DIAGNOSIS — K429 Umbilical hernia without obstruction or gangrene: Secondary | ICD-10-CM | POA: Diagnosis not present

## 2023-12-30 DIAGNOSIS — L700 Acne vulgaris: Secondary | ICD-10-CM | POA: Diagnosis not present

## 2023-12-30 DIAGNOSIS — G8929 Other chronic pain: Secondary | ICD-10-CM | POA: Diagnosis not present

## 2023-12-30 NOTE — Progress Notes (Signed)
 New Patient Office Visit  Subjective:  Patient ID: Michelle Lang, female    DOB: 14-Jun-1985  Age: 38 y.o. MRN: [id number]  CC:  Chief Complaint  Patient presents with   Establish Care    Pt is here for New Pt, Transfer of Care, she has some cystic acne going on, wants a referral for a Hernia repair, and she has right side lower pain    Discussed the use of AI scribe software for clinical note transcription with the patient, who gave verbal consent to proceed.  History of Present Illness Michelle Lang is a 38 year old female who presents for est care.  She has a history of inflammatory arthritis affecting her knees and ankles, currently well-managed with Humira , allowing her to maintain an active lifestyle, including running. A previous flare-up occurred due to irregular medication use. She follows up with a rheumatologist every six months for monitoring and blood work.  She experiences cystic acne exacerbation on her back. Her skin issues date back to adolescence, with temporary improvement during pregnancy and breastfeeding. Topical treatments like salicylic acid and sports wash have been ineffective. She has not used prescription Retin A recently due to past side effects. She is considering birth control for acne management, having previously used Sprintec with good results.  She has an umbilical hernia from her last pregnancy, causing discomfort, especially postprandially. She can manually reduce the hernia, but it remains sore and uncomfortable.  She experiences a sensation of fullness and tenderness in the right lower quadrant, a long-standing issue. A CT scan in 2020 showed no abnormalities. She associates more of the discomfort with ovulation and notes it does not significantly impact her quality of life. But also, whenever palpated, tender.  Her surgical history includes a C-section and appendectomy, raising the possibility of scar tissue involvement.  No  major chest pains, heart racing, coughing, shortness of breath, depression, or significant headaches. She experiences a headache before her period, managed with Tylenol , but denies migraines or auras.     Current Outpatient Medications:    adalimumab  (HUMIRA , 2 PEN,) 40 MG/0.4ML pen, Inject 40 mg (1 pen) every 14 days., Disp: 2 each, Rfl: 3   cetirizine (ZYRTEC ALLERGY) 10 MG tablet, 1 tablet Orally Once a day, Disp: , Rfl:    COVID-19 mRNA vaccine 2023-2024 (COMIRNATY ) syringe, Inject into the muscle., Disp: 0.3 mL, Rfl: 0   fluticasone  (FLONASE ) 50 MCG/ACT nasal spray, Place 2 sprays into both nostrils daily., Disp: 16 g, Rfl: 6   ibuprofen  (ADVIL ) 600 MG tablet, Take 1 tablet (600 mg total) by mouth every 6 (six) hours., Disp: 30 tablet, Rfl: 0  Past Medical History:  Diagnosis Date   Depression    mild seasonal dep   Inflammatory arthritis 2014    Past Surgical History:  Procedure Laterality Date   APPENDECTOMY  2003   CESAREAN SECTION N/A 04/20/2017   Procedure: CESAREAN SECTION;  Surgeon: Latisha Medford, MD;  Location: [facility] BIRTHING SUITES;  Service: Obstetrics;  Laterality: N/A;  Primary edc 05/10/17 NKDA   WISDOM TOOTH EXTRACTION      Family History  Problem Relation Age of Onset   Hyperlipidemia Mother    Hypertension Mother    Cancer Father        melanoma back   Pernicious anemia Father    Seizures Brother    Hypertension Paternal Grandmother    Kidney cancer Paternal Grandmother 40 - 12   Hypertension Paternal Grandfather  Arthritis Neg Hx    Diabetes Neg Hx    Heart disease Neg Hx    Mental retardation Neg Hx    Birth defects Neg Hx     Social History   Socioeconomic History   Marital status: Married    Spouse name: Michelle Lang   Number of children: 2   Years of education: college   Highest education level: Not on file  Occupational History   Occupation: Oncologist: [employer]    Comment: In home, 38 yo boy  Tobacco Use   Smoking status: Never    Smokeless tobacco: Never  Vaping Use   Vaping status: Never Used  Substance and Sexual Activity   Alcohol use: Not Currently   Drug use: No   Sexual activity: Yes    Partners: Male    Comment: vasec  Other Topics Concern   Not on file  Social History Narrative   She is married (Michelle Lang)      She enjoys running (ran marathons in the past).   Currently for exercise she enjoys walking, jogging, yoga she exercises 5-6 days a week.   Social Drivers of Corporate Investment Banker Strain: Not on file  Food Insecurity: Not on file  Transportation Needs: Not on file  Physical Activity: Not on file  Stress: Not on file  Social Connections: Not on file  Intimate Partner Violence: Not on file    ROS  ROS: Gen: no fever, chills  Skin: no rash, itching ENT: no ear pain, ear drainage, nasal congestion, rhinorrhea, sinus pressure, sore throat Eyes: no blurry vision, double vision Resp: no cough, wheeze,SOB CV: no CP, palpitations, LE edema,  GI: no heartburn, n/v/d/c, abd pain GU: no dysuria, urgency, frequency, hematuria MSK: no joint pain, myalgias, back pain Neuro: no dizziness, headache, weakness, vertigo Psych: no depression, anxiety, insomnia, SI   Objective:   Today's Vitals: BP 112/70 (BP Location: Left Arm, Patient Position: Sitting, Cuff Size: Normal)   Pulse 88   Temp 97.8 F (36.6 C) (Temporal)   Ht 5' 4 (1.626 m)   Wt 135 lb 4 oz (61.3 kg)   LMP 12/14/2023 (Approximate)   SpO2 98%   Breastfeeding No   BMI 23.22 kg/m   Physical Exam  Physical Exam GENERAL: Well developed, well nourished, no acute distress. HEAD EYES EARS NOSE THROAT: Normocephalic, atraumatic, conjunctiva not injected, sclera nonicteric. CARDIAC: Regular rate and rhythm, S1 S2 present, no murmur,  NECK: Supple, no thyromegaly, no nodes, no carotid bruits. LUNGS: Clear to auscultation bilaterally, no wheezes. ABDOMEN: Bowel sounds present, soft, mod tenderness and fullness in right  lower quadrant, small hernia above umbilicus, non-distended, no hepatosplenomegaly,. EXTREMITIES: No edema. MUSCULOSKELETAL: No gross abnormalities. NEUROLOGICAL: Alert and oriented x3, cranial nerves II through XII intact. PSYCHIATRIC: Normal mood, good eye contact. SKIN: Cystic acne on chin and face, acne on upper back with comedones and cysts, comedones on chest.    Assessment & Plan:  Umbilical hernia without obstruction and without gangrene -     Ambulatory referral to General Surgery  Chronic RLQ pain -     CT ABDOMEN PELVIS W CONTRAST; Future  Abdominal fullness in right lower quadrant -     CT ABDOMEN PELVIS W CONTRAST; Future  Cystic acne  Inflammatory arthritis  Flu vaccine need -     Flu vaccine trivalent PF, 6mos and older(Flulaval ,Afluria,Fluarix ,Fluzone)    Assessment and Plan Assessment & Plan Cystic and comedonal acne   Recent exacerbation  on her face, upper back, and chest is likely hormonal. Previous treatments with topical agents and Retin A had limited success. Consider topical antibiotics until dermatologist consultation. Discuss birth control option (Sprintec) for acne management, aware of increased blood clot risk. Consult dermatologist for further management options, including potential Accutane use.  Right lower quadrant abdominal pain   Chronic pain with fullness and tenderness. Previous CT scan in 2020 showed no abnormalities. Differential includes scar tissue or endometriosis, ovulation, mass, other. No significant impact on quality of life but requires monitoring. Order CT scan to reassess right lower quadrant fullness and tenderness. Consider scar tissue or endometriosis as potential causes.  Umbilical hernia   Present since last pregnancy, causing discomfort, especially postprandially. Can be manually reduced but causes soreness. Refer to surgeon for evaluation and potential repair.  Inflammatory arthritis   Atypical inflammatory arthritis managed  with Humira . Symptoms well-controlled with regular medication adherence. Continue Humira  as prescribed. Follow up with rheumatologist every six months for blood work.  Adult Wellness Visit   Routine visit with no chronic conditions requiring primary care management. Regular follow-ups with specialists for existing conditions. Schedule physical exam and blood work in a few months to avoid end-of-year rush. Administer flu shot today.      Follow-up: Return in about 3 months (around 03/31/2024) for annual physical.   Jenkins CHRISTELLA Carrel, MD

## 2023-12-30 NOTE — Patient Instructions (Signed)
 Welcome to [facility]! It was a pleasure meeting you today.  As discussed, Please schedule a 3 month follow up visit today.  [facility] [address]. [place], [place] [phone]   See Dermatology.  Can call for meds here if too far out  CT ordered  PLEASE NOTE:  If you had any LAB tests please let us  know if you have not heard back within a few days. You may see your results on MyChart before we have a chance to review them but we will give you a call once they are reviewed by us . If we ordered any REFERRALS today, please let us  know if you have not heard from their office within the next week.  Let us  know through MyChart if you are needing REFILLS, or have your pharmacy send us  the request. You can also use MyChart to communicate with me or any office staff.  Please try these tips to maintain a healthy lifestyle:  Eat most of your calories during the day when you are active. Eliminate processed foods including packaged sweets (pies, cakes, cookies), reduce intake of potatoes, white bread, white pasta, and white rice. Look for whole grain options, oat flour or almond flour.  Each meal should contain half fruits/vegetables, one quarter protein, and one quarter carbs (no bigger than a computer mouse).  Cut down on sweet beverages. This includes juice, soda, and sweet tea. Also watch fruit intake, though this is a healthier sweet option, it still contains natural sugar! Limit to 3 servings daily.  Drink at least 1 glass of water with each meal and aim for at least 8 glasses per day  Exercise at least 150 minutes every week.

## 2024-01-03 ENCOUNTER — Other Ambulatory Visit: Payer: Self-pay

## 2024-01-04 ENCOUNTER — Encounter: Payer: Self-pay | Admitting: Family Medicine

## 2024-01-04 ENCOUNTER — Other Ambulatory Visit (HOSPITAL_COMMUNITY): Payer: Self-pay

## 2024-01-04 ENCOUNTER — Other Ambulatory Visit: Payer: Self-pay

## 2024-01-04 NOTE — Progress Notes (Signed)
 Specialty Pharmacy Refill Coordination Note  Spoke with Michelle Lang is a 38 y.o. female contacted today regarding refills of specialty medication(s) Adalimumab  (Humira  (2 Pen))  Doses on hand: 0   Injection date: 01/07/24  Patient requested: [name] at [facility] at [place] date: 01/05/24  Medication will be filled on 01/04/24 .

## 2024-01-05 ENCOUNTER — Other Ambulatory Visit

## 2024-01-09 ENCOUNTER — Ambulatory Visit
Admission: RE | Admit: 2024-01-09 | Discharge: 2024-01-09 | Disposition: A | Discharge status: Home / Self Care | Source: Ambulatory Visit | Attending: Family Medicine | Admitting: Family Medicine

## 2024-01-09 ENCOUNTER — Telehealth

## 2024-01-09 ENCOUNTER — Other Ambulatory Visit (HOSPITAL_COMMUNITY): Payer: Self-pay

## 2024-01-09 ENCOUNTER — Telehealth: Admitting: Physician Assistant

## 2024-01-09 DIAGNOSIS — J020 Streptococcal pharyngitis: Secondary | ICD-10-CM | POA: Diagnosis not present

## 2024-01-09 DIAGNOSIS — G8929 Other chronic pain: Secondary | ICD-10-CM

## 2024-01-09 DIAGNOSIS — R198 Other specified symptoms and signs involving the digestive system and abdomen: Secondary | ICD-10-CM

## 2024-01-09 DIAGNOSIS — R1031 Right lower quadrant pain: Secondary | ICD-10-CM | POA: Diagnosis not present

## 2024-01-09 MED ORDER — IOPAMIDOL (ISOVUE-300) INJECTION 61%
80.0000 mL | Freq: Once | INTRAVENOUS | Status: AC | PRN
  Administered 2024-01-09: 80 mL via INTRAVENOUS

## 2024-01-09 MED ORDER — AMOXICILLIN 500 MG PO CAPS
500.0000 mg | ORAL_CAPSULE | Freq: Two times a day (BID) | ORAL | 0 refills | Status: AC
  Filled 2024-01-09: qty 20, 10d supply, fill #0

## 2024-01-09 NOTE — Progress Notes (Signed)

## 2024-01-10 ENCOUNTER — Ambulatory Visit: Payer: Self-pay | Admitting: Family Medicine

## 2024-01-10 NOTE — Progress Notes (Signed)
 Tiny hernia-no bowel in it(good).  Has she scheduled with surgery yet? 2.  Nodular density in endometrial cavity.  She has a hysterogram in 2018 which saw same thing.  She may want to f/u gyn but probably the same thing-does she still see gyn?

## 2024-01-16 ENCOUNTER — Ambulatory Visit: Payer: Self-pay | Admitting: Surgery

## 2024-01-16 ENCOUNTER — Encounter: Payer: Self-pay | Admitting: Family Medicine

## 2024-01-16 DIAGNOSIS — K439 Ventral hernia without obstruction or gangrene: Secondary | ICD-10-CM | POA: Diagnosis not present

## 2024-01-16 NOTE — H&P (Signed)
 Subjective   Chief Complaint: New Consultation ( Umbilical hernia)     History of Present Illness: Michelle Lang is a 38 y.o. female who is seen today as an office consultation at the request of Dr. Wendolyn for evaluation of New Consultation ( Umbilical hernia) .    This is a 37 year old female with a history of inflammatory arthritis managed on Humira .  She is 3 years postpartum.  After her pregnancy, she developed a bulge approximately 3 cm above her umbilicus.  This has remained visible and reducible.  It is causing some increasing discomfort.  She denies any obstructive symptoms.  Recently she had a CT scan of the abdomen pelvis for a different reason.  Incidentally this noted a small epigastric hernia containing only fat.  The defect measured 1 cm.  She presents now to discuss hernia repair.  She is considering an abdominoplasty by Dr. Elisabeth and may consider having a combined procedure.   Review of Systems: A complete review of systems was obtained from the patient.  I have reviewed this information and discussed as appropriate with the patient.  See HPI as well for other ROS.  Review of Systems  Constitutional: Negative.   HENT: Negative.    Eyes: Negative.   Respiratory: Negative.    Cardiovascular: Negative.   Gastrointestinal: Negative.   Genitourinary: Negative.   Musculoskeletal: Negative.   Skin: Negative.   Neurological: Negative.   Endo/Heme/Allergies: Negative.   Psychiatric/Behavioral: Negative.        Medical History: Past Medical History:  Diagnosis Date   Depression    Inflammatory arthritis     Patient Active Problem List  Diagnosis   History of cesarean section, low transverse   Inflammatory arthritis   Tibialis posterior tendonitis, right   VBAC (vaginal birth after Cesarean) ([facility])    Past Surgical History:  Procedure Laterality Date   APPENDECTOMY  2003   Cesarean section (N/A)  04/20/2017   Wisdom tooth extraction        Allergies  Allergen Reactions   Other Other (See Comments)    Honeycomb bandage causes blistering     Current Outpatient Medications on File Prior to Visit  Medication Sig Dispense Refill   HUMIRA ,CF, PEN 40 mg/0.4 mL pen injector kit      No current facility-administered medications on file prior to visit.    Family History  Problem Relation Age of Onset   Hyperlipidemia (Elevated cholesterol) Mother    High blood pressure (Hypertension) Mother    Pernicious anemia Father    Skin cancer Father    Seizures Brother    Kidney cancer Paternal Grandmother    High blood pressure (Hypertension) Paternal Grandmother    High blood pressure (Hypertension) Paternal Grandfather    Arthritis Neg Hx    Heart disease Neg Hx    Mental retardation Neg Hx    Birth defects Neg Hx    Diabetes Neg Hx      Social History   Tobacco Use  Smoking Status Never  Smokeless Tobacco Never     Social History   Socioeconomic History   Marital status: Married  Tobacco Use   Smoking status: Never   Smokeless tobacco: Never  Substance and Sexual Activity   Drug use: Never   Social Drivers of Health   Housing Stability: Unknown (01/16/2024)   Housing Stability Vital Sign    Homeless in the Last Year: No    Objective:    Vitals:   01/16/24 1010  BP: 119/78  Pulse: 75  Temp: 36.7 C (98 F)  SpO2: 98%  Weight: 61.2 kg (135 lb)  Height: 162.6 cm (5' 4)    Body mass index is 23.17 kg/m.  Physical Exam   Constitutional:  WDWN in NAD, conversant, no obvious deformities; lying in bed comfortably Eyes:  Pupils equal, round; sclera anicteric; moist conjunctiva; no lid lag HENT:  Oral mucosa moist; good dentition  Neck:  No masses palpated, trachea midline; no thyromegaly Lungs:  CTA bilaterally; normal respiratory effort CV:  Regular rate and rhythm; no murmurs; extremities well-perfused with no edema Abd:  +bowel sounds, soft, non-tender, no palpable organomegaly; visible  palpable reducible hernia approximately 3 cm above the umbilicus.  The hernia sac measures approximately 3 cm in diameter.  When reduced, the defect is about 1 cm.  No sign of umbilical hernia Musc: Normal gait; no apparent clubbing or cyanosis in extremities Lymphatic:  No palpable cervical or axillary lymphadenopathy Skin:  Warm, dry; no sign of jaundice Psychiatric - alert and oriented x 4; calm mood and affect   Labs, Imaging and Diagnostic Testing: CLINICAL DATA:  Chronic right lower quadrant pain and fullness for several years.   EXAM: CT ABDOMEN AND PELVIS WITH CONTRAST   TECHNIQUE: Multidetector CT imaging of the abdomen and pelvis was performed using the standard protocol following bolus administration of intravenous contrast.   RADIATION DOSE REDUCTION: This exam was performed according to the departmental dose-optimization program which includes automated exposure control, adjustment of the mA and/or kV according to patient size and/or use of iterative reconstruction technique.   CONTRAST:  80mL ISOVUE -300 IOPAMIDOL  (ISOVUE -300) INJECTION 61%   COMPARISON:  09/28/2018   FINDINGS: Lower Chest: No acute findings.   Hepatobiliary: No suspicious hepatic masses identified. 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 Gallbladder is unremarkable. No evidence of biliary ductal dilatation.   Pancreas:  No mass or inflammatory changes.   Spleen: Within normal limits in size and appearance.   Adrenals/Urinary Tract: No suspicious masses identified. No evidence of ureteral calculi or hydronephrosis. Unremarkable unopacified urinary bladder.   Stomach/Bowel: No evidence of obstruction, inflammatory process or abnormal fluid collections. Although the appendix is not directly visualized, no inflammatory process seen in region of the cecum or elsewhere.   Vascular/Lymphatic: No pathologically enlarged lymph nodes. No acute vascular  findings.   Reproductive: 1 cm enhancing nodular density is seen in the endometrial cavity in the uterine body, suspicious for an endometrial polyp. Both ovaries are normal in appearance. Trace amount of free fluid noted in pelvis. Tampon noted in vagina.   Other: Tiny midline epigastric ventral hernia is seen which contains only fat.   Musculoskeletal:  No suspicious bone lesions identified.   IMPRESSION: No acute findings.   1 cm enhancing nodular density in endometrial cavity, suspicious for an endometrial polyp. Consider sonohysterogram or pelvic ultrasound for further evaluation.   Tiny epigastric ventral hernia, which contains only fat.     Electronically Signed   By: Norleen DELENA Kil M.D.   On: 01/10/2024 15:04    Assessment and Plan:  Diagnoses and all orders for this visit:  Ventral hernia without obstruction or gangrene  Recommend open repair of epigastric ventral hernia.  It is unlikely that we will need to use mesh due to the small size of the defect.  We can perform this in conjunction with an abdominoplasty by Dr. Elisabeth or we can perform this as a separate procedure.  The patient  will call and let us  know.  The surgical procedure has been discussed with the patient.  Potential risks, benefits, alternative treatments, and expected outcomes have been explained.  All of the patient's questions at this time have been answered.  The likelihood of reaching the patient's treatment goal is good.  The patient understands the proposed surgical procedure and wishes to proceed.    Michelle Meckes DEWAYNE LIMA, MD  01/16/2024 10:39 AM

## 2024-01-27 ENCOUNTER — Other Ambulatory Visit: Payer: Self-pay

## 2024-01-30 ENCOUNTER — Other Ambulatory Visit (HOSPITAL_COMMUNITY): Payer: Self-pay

## 2024-01-30 ENCOUNTER — Other Ambulatory Visit: Payer: Self-pay

## 2024-01-30 ENCOUNTER — Other Ambulatory Visit: Payer: Self-pay | Admitting: Pharmacist

## 2024-01-30 MED ORDER — HUMIRA (2 PEN) 40 MG/0.4ML ~~LOC~~ AJKT
AUTO-INJECTOR | SUBCUTANEOUS | 2 refills | Status: AC
  Filled 2024-01-31: qty 2, 28d supply, fill #0
  Filled 2024-01-31: qty 2, fill #0
  Filled 2024-02-27: qty 2, 28d supply, fill #1
  Filled 2024-03-22: qty 2, 28d supply, fill #2

## 2024-01-30 MED ORDER — HUMIRA (2 PEN) 40 MG/0.4ML ~~LOC~~ AJKT: AUTO-INJECTOR | SUBCUTANEOUS | 2 refills | Status: DC

## 2024-01-31 ENCOUNTER — Other Ambulatory Visit (HOSPITAL_COMMUNITY): Payer: Self-pay

## 2024-01-31 ENCOUNTER — Other Ambulatory Visit: Payer: Self-pay

## 2024-02-02 ENCOUNTER — Other Ambulatory Visit: Payer: Self-pay

## 2024-02-06 ENCOUNTER — Other Ambulatory Visit: Payer: Self-pay

## 2024-02-06 ENCOUNTER — Other Ambulatory Visit: Payer: Self-pay | Admitting: Pharmacy Technician

## 2024-02-06 NOTE — Progress Notes (Signed)
 Specialty Pharmacy Refill Coordination Note  Michelle Lang is a 38 y.o. female contacted today regarding refills of specialty medication(s) Adalimumab  (Humira  (2 Pen))   Patient requested Pickup at [facility] at [place] date: 02/07/24   Medication will be filled on: 02/06/24

## 2024-02-27 ENCOUNTER — Other Ambulatory Visit (HOSPITAL_COMMUNITY): Payer: Self-pay

## 2024-02-29 ENCOUNTER — Other Ambulatory Visit: Payer: Self-pay | Admitting: Pharmacy Technician

## 2024-02-29 ENCOUNTER — Other Ambulatory Visit: Payer: Self-pay

## 2024-02-29 NOTE — Progress Notes (Signed)
 Specialty Pharmacy Refill Coordination Note  Michelle Lang Michelle Lang is a 38 y.o. female contacted today regarding refills of specialty medication(s) Adalimumab  (Humira  (2 Pen))   Patient requested Pickup at [facility] at [place] date: 03/02/24   Medication will be filled on: 02/29/24

## 2024-03-12 ENCOUNTER — Other Ambulatory Visit (HOSPITAL_COMMUNITY): Payer: Self-pay

## 2024-03-12 MED ORDER — NORGESTIMATE-ETH ESTRADIOL 0.25-35 MG-MCG PO TABS
1.0000 | ORAL_TABLET | Freq: Every day | ORAL | 3 refills | Status: AC
  Filled 2024-03-12: qty 84, 84d supply, fill #0

## 2024-03-20 ENCOUNTER — Other Ambulatory Visit (HOSPITAL_COMMUNITY): Payer: Self-pay

## 2024-03-20 ENCOUNTER — Other Ambulatory Visit: Payer: Self-pay

## 2024-03-20 MED ORDER — DROSPIRENONE-ETHINYL ESTRADIOL 3-0.02 MG PO TABS
1.0000 | ORAL_TABLET | Freq: Every day | ORAL | 3 refills | Status: AC
  Filled 2024-03-20: qty 84, 84d supply, fill #0

## 2024-03-22 ENCOUNTER — Other Ambulatory Visit: Payer: Self-pay

## 2024-03-26 ENCOUNTER — Other Ambulatory Visit: Payer: Self-pay

## 2024-03-28 ENCOUNTER — Other Ambulatory Visit: Payer: Self-pay

## 2024-03-28 NOTE — Progress Notes (Signed)
 Specialty Pharmacy Refill Coordination Note  Michelle Lang is a 39 y.o. female contacted today regarding refills of specialty medication(s) Adalimumab  (Humira  (2 Pen))   Patient requested Pickup at [facility] at [place] date: 03/30/24   Medication will be filled on: 03/28/24

## 2024-03-29 ENCOUNTER — Other Ambulatory Visit (HOSPITAL_COMMUNITY): Payer: Self-pay

## 2024-04-09 ENCOUNTER — Encounter: Admitting: Family Medicine
